# Patient Record
Sex: Female | Born: 1970 | Race: White | Hispanic: No | Marital: Single | Smoking: Current every day smoker
Health system: Southern US, Community
[De-identification: ages and names within clinical notes are randomized; demographics above are authoritative.]

## PROBLEM LIST (undated history)

## (undated) ENCOUNTER — Emergency Department: Discharge status: Absent without leave | Payer: Self-pay

## (undated) DIAGNOSIS — N189 Chronic kidney disease, unspecified: Secondary | ICD-10-CM

## (undated) DIAGNOSIS — R569 Unspecified convulsions: Secondary | ICD-10-CM

## (undated) DIAGNOSIS — G4730 Sleep apnea, unspecified: Secondary | ICD-10-CM

## (undated) DIAGNOSIS — K219 Gastro-esophageal reflux disease without esophagitis: Secondary | ICD-10-CM

## (undated) HISTORY — PX: ABDOMINAL HYSTERECTOMY: SHX81

## (undated) HISTORY — PX: WISDOM TOOTH EXTRACTION: SHX21

## (undated) HISTORY — PX: KNEE SURGERY: SHX244

---

## 1998-04-13 ENCOUNTER — Emergency Department (HOSPITAL_COMMUNITY): Admission: EM | Admit: 1998-04-13 | Discharge: 1998-04-13 | Payer: Self-pay | Admitting: Emergency Medicine

## 1998-06-19 ENCOUNTER — Emergency Department (HOSPITAL_COMMUNITY): Admission: EM | Admit: 1998-06-19 | Discharge: 1998-06-19 | Payer: Self-pay | Admitting: Emergency Medicine

## 2002-10-09 ENCOUNTER — Emergency Department (HOSPITAL_COMMUNITY): Admission: EM | Admit: 2002-10-09 | Discharge: 2002-10-10 | Payer: Self-pay | Admitting: Emergency Medicine

## 2002-10-09 ENCOUNTER — Encounter: Payer: Self-pay | Admitting: Emergency Medicine

## 2002-11-06 ENCOUNTER — Encounter: Payer: Self-pay | Admitting: Neurology

## 2002-11-06 ENCOUNTER — Ambulatory Visit (HOSPITAL_COMMUNITY): Admission: RE | Admit: 2002-11-06 | Discharge: 2002-11-06 | Payer: Self-pay | Admitting: Neurology

## 2003-10-28 ENCOUNTER — Other Ambulatory Visit: Payer: Self-pay

## 2004-07-13 ENCOUNTER — Emergency Department: Payer: Self-pay | Admitting: Emergency Medicine

## 2004-07-14 ENCOUNTER — Emergency Department: Payer: Self-pay | Admitting: Emergency Medicine

## 2005-03-31 ENCOUNTER — Emergency Department: Payer: Self-pay | Admitting: Emergency Medicine

## 2006-04-25 ENCOUNTER — Emergency Department: Payer: Self-pay

## 2006-09-09 ENCOUNTER — Emergency Department: Payer: Self-pay | Admitting: Unknown Physician Specialty

## 2008-04-09 ENCOUNTER — Ambulatory Visit: Payer: Self-pay | Admitting: Internal Medicine

## 2008-05-23 ENCOUNTER — Other Ambulatory Visit: Payer: Self-pay

## 2008-05-23 ENCOUNTER — Ambulatory Visit: Payer: Self-pay | Admitting: Obstetrics and Gynecology

## 2008-05-23 ENCOUNTER — Ambulatory Visit: Payer: Self-pay | Admitting: Cardiology

## 2008-05-31 ENCOUNTER — Inpatient Hospital Stay: Payer: Self-pay | Admitting: Obstetrics and Gynecology

## 2009-04-11 ENCOUNTER — Emergency Department: Payer: Self-pay | Admitting: Unknown Physician Specialty

## 2009-04-22 ENCOUNTER — Ambulatory Visit: Payer: Self-pay | Admitting: Internal Medicine

## 2009-06-08 ENCOUNTER — Emergency Department: Payer: Self-pay | Admitting: Internal Medicine

## 2009-11-10 ENCOUNTER — Emergency Department: Payer: Self-pay | Admitting: Emergency Medicine

## 2009-12-16 ENCOUNTER — Emergency Department: Payer: Self-pay

## 2010-05-14 ENCOUNTER — Emergency Department: Payer: Self-pay | Admitting: Emergency Medicine

## 2010-07-09 ENCOUNTER — Emergency Department: Payer: Self-pay | Admitting: Emergency Medicine

## 2010-07-21 ENCOUNTER — Ambulatory Visit: Payer: Self-pay | Admitting: Internal Medicine

## 2011-01-30 ENCOUNTER — Emergency Department: Payer: Self-pay | Admitting: Emergency Medicine

## 2011-02-04 ENCOUNTER — Emergency Department: Payer: Self-pay | Admitting: Emergency Medicine

## 2011-12-27 ENCOUNTER — Emergency Department: Payer: Self-pay | Admitting: Emergency Medicine

## 2011-12-27 LAB — DRUG SCREEN, URINE
Amphetamines, Ur Screen: NEGATIVE (ref ?–1000)
Barbiturates, Ur Screen: NEGATIVE (ref ?–200)
Cannabinoid 50 Ng, Ur ~~LOC~~: NEGATIVE (ref ?–50)
MDMA (Ecstasy)Ur Screen: NEGATIVE (ref ?–500)
Phencyclidine (PCP) Ur S: NEGATIVE (ref ?–25)
Tricyclic, Ur Screen: NEGATIVE (ref ?–1000)

## 2011-12-27 LAB — COMPREHENSIVE METABOLIC PANEL
Albumin: 4 g/dL (ref 3.4–5.0)
Alkaline Phosphatase: 76 U/L (ref 50–136)
Anion Gap: 9 (ref 7–16)
Bilirubin,Total: 0.5 mg/dL (ref 0.2–1.0)
Co2: 25 mmol/L (ref 21–32)
Creatinine: 0.87 mg/dL (ref 0.60–1.30)
EGFR (African American): >60
EGFR (Non-African Amer.): >60
Glucose: 130 mg/dL — ABNORMAL HIGH (ref 65–99)
Osmolality: 286 (ref 275–301)
Potassium: 4 mmol/L (ref 3.5–5.1)
Total Protein: 7.5 g/dL (ref 6.4–8.2)

## 2011-12-27 LAB — URINALYSIS, COMPLETE
Bilirubin,UR: NEGATIVE
Blood: NEGATIVE
Ketone: NEGATIVE
Ph: 7 (ref 4.5–8.0)
RBC,UR: <1 /HPF (ref 0–5)
Squamous Epithelial: 6
WBC UR: 1 /HPF (ref 0–5)

## 2011-12-27 LAB — TROPONIN I: Troponin-I: <0.02 ng/mL

## 2011-12-27 LAB — CBC
HGB: 14.1 g/dL (ref 12.0–16.0)
MCV: 93 fL (ref 80–100)
RDW: 12.2 % (ref 11.5–14.5)

## 2011-12-27 LAB — CK TOTAL AND CKMB (NOT AT ARMC): CK, Total: 84 U/L (ref 21–215)

## 2012-01-20 ENCOUNTER — Emergency Department: Payer: Self-pay | Admitting: *Deleted

## 2012-01-20 LAB — URINALYSIS, COMPLETE
Bilirubin,UR: NEGATIVE
Blood: NEGATIVE
Glucose,UR: NEGATIVE mg/dL (ref 0–75)
Ketone: NEGATIVE
Leukocyte Esterase: NEGATIVE
Nitrite: NEGATIVE
Specific Gravity: 1.004 (ref 1.003–1.030)
Squamous Epithelial: <1
WBC UR: <1 /HPF (ref 0–5)

## 2012-01-20 LAB — BASIC METABOLIC PANEL
BUN: 17 mg/dL (ref 7–18)
Calcium, Total: 8.6 mg/dL (ref 8.5–10.1)
Chloride: 106 mmol/L (ref 98–107)
Co2: 27 mmol/L (ref 21–32)
EGFR (African American): >60
Glucose: 126 mg/dL — ABNORMAL HIGH (ref 65–99)
Sodium: 141 mmol/L (ref 136–145)

## 2012-01-20 LAB — CBC WITH DIFFERENTIAL/PLATELET
Basophil #: 0 10*3/uL (ref 0.0–0.1)
Eosinophil #: 0.1 10*3/uL (ref 0.0–0.7)
HCT: 39.9 % (ref 35.0–47.0)
Lymphocyte %: 30.8 %
Monocyte #: 0.4 x10 3/mm (ref 0.2–0.9)
Neutrophil #: 3.9 10*3/uL (ref 1.4–6.5)
Neutrophil %: 60.1 %
Platelet: 193 10*3/uL (ref 150–440)
RBC: 4.32 10*6/uL (ref 3.80–5.20)
WBC: 6.4 10*3/uL (ref 3.6–11.0)

## 2012-03-18 ENCOUNTER — Emergency Department: Payer: Self-pay | Admitting: Emergency Medicine

## 2012-04-04 ENCOUNTER — Emergency Department: Payer: Self-pay | Admitting: Emergency Medicine

## 2012-04-04 LAB — CBC
HCT: 39.9 % (ref 35.0–47.0)
HGB: 14.3 g/dL (ref 12.0–16.0)
MCV: 91 fL (ref 80–100)
RDW: 12.4 % (ref 11.5–14.5)
WBC: 7.9 10*3/uL (ref 3.6–11.0)

## 2012-04-04 LAB — CK TOTAL AND CKMB (NOT AT ARMC): CK, Total: 96 U/L (ref 21–215)

## 2012-04-04 LAB — URINALYSIS, COMPLETE
Bacteria: NONE SEEN
Bilirubin,UR: NEGATIVE
Blood: NEGATIVE
Glucose,UR: NEGATIVE mg/dL (ref 0–75)
RBC,UR: NONE SEEN /HPF (ref 0–5)
Specific Gravity: 1.005 (ref 1.003–1.030)
Squamous Epithelial: <1
WBC UR: NONE SEEN /HPF (ref 0–5)

## 2012-04-04 LAB — COMPREHENSIVE METABOLIC PANEL
Albumin: 4.1 g/dL (ref 3.4–5.0)
Alkaline Phosphatase: 74 U/L (ref 50–136)
Anion Gap: 7 (ref 7–16)
Chloride: 107 mmol/L (ref 98–107)
Co2: 26 mmol/L (ref 21–32)
Creatinine: 0.95 mg/dL (ref 0.60–1.30)
Osmolality: 279 (ref 275–301)
SGOT(AST): 24 U/L (ref 15–37)
Sodium: 140 mmol/L (ref 136–145)
Total Protein: 7.6 g/dL (ref 6.4–8.2)

## 2012-04-04 LAB — TROPONIN I: Troponin-I: <0.02 ng/mL

## 2012-06-05 ENCOUNTER — Emergency Department: Payer: Self-pay | Admitting: Emergency Medicine

## 2012-12-02 ENCOUNTER — Emergency Department: Payer: Self-pay | Admitting: Emergency Medicine

## 2013-04-23 ENCOUNTER — Emergency Department: Payer: Self-pay | Admitting: Emergency Medicine

## 2013-04-23 LAB — CBC WITH DIFFERENTIAL/PLATELET
Eosinophil #: 0.4 10*3/uL (ref 0.0–0.7)
Eosinophil %: 3.1 %
HCT: 39.1 % (ref 35.0–47.0)
Lymphocyte #: 3 10*3/uL (ref 1.0–3.6)
Lymphocyte %: 26.8 %
MCHC: 34.3 g/dL (ref 32.0–36.0)
MCV: 92 fL (ref 80–100)
Monocyte #: 0.8 x10 3/mm (ref 0.2–0.9)
Neutrophil #: 7 10*3/uL — ABNORMAL HIGH (ref 1.4–6.5)
RDW: 12.7 % (ref 11.5–14.5)
WBC: 11.2 10*3/uL — ABNORMAL HIGH (ref 3.6–11.0)

## 2013-04-23 LAB — BASIC METABOLIC PANEL
Anion Gap: 5 — ABNORMAL LOW (ref 7–16)
BUN: 15 mg/dL (ref 7–18)
Calcium, Total: 9.1 mg/dL (ref 8.5–10.1)
Chloride: 110 mmol/L — ABNORMAL HIGH (ref 98–107)
Co2: 26 mmol/L (ref 21–32)
Creatinine: 0.92 mg/dL (ref 0.60–1.30)
Glucose: 85 mg/dL (ref 65–99)
Osmolality: 281 (ref 275–301)

## 2013-04-29 ENCOUNTER — Emergency Department: Payer: Self-pay | Admitting: Emergency Medicine

## 2013-04-29 LAB — URINALYSIS, COMPLETE
Bacteria: NONE SEEN
Blood: NEGATIVE
Glucose,UR: NEGATIVE mg/dL (ref 0–75)
Ketone: NEGATIVE
Leukocyte Esterase: NEGATIVE
Ph: 6 (ref 4.5–8.0)
Specific Gravity: 1.01 (ref 1.003–1.030)
WBC UR: 3 /HPF (ref 0–5)

## 2013-04-29 LAB — COMPREHENSIVE METABOLIC PANEL
Albumin: 3.5 g/dL (ref 3.4–5.0)
Alkaline Phosphatase: 94 U/L (ref 50–136)
BUN: 12 mg/dL (ref 7–18)
Calcium, Total: 8.5 mg/dL (ref 8.5–10.1)
Co2: 25 mmol/L (ref 21–32)
EGFR (African American): >60
Osmolality: 279 (ref 275–301)
SGOT(AST): 19 U/L (ref 15–37)
Total Protein: 7.1 g/dL (ref 6.4–8.2)

## 2013-04-29 LAB — CBC
HGB: 13.6 g/dL (ref 12.0–16.0)
MCHC: 35.3 g/dL (ref 32.0–36.0)
MCV: 92 fL (ref 80–100)
Platelet: 199 10*3/uL (ref 150–440)
RDW: 12.8 % (ref 11.5–14.5)

## 2013-05-03 LAB — BETA STREP CULTURE(ARMC)

## 2013-09-12 ENCOUNTER — Emergency Department: Payer: Self-pay | Admitting: Emergency Medicine

## 2013-09-14 ENCOUNTER — Emergency Department: Payer: Self-pay | Admitting: Emergency Medicine

## 2013-09-14 LAB — BASIC METABOLIC PANEL
Anion Gap: 6 — ABNORMAL LOW (ref 7–16)
BUN: 14 mg/dL (ref 7–18)
CALCIUM: 8.9 mg/dL (ref 8.5–10.1)
CHLORIDE: 106 mmol/L (ref 98–107)
Co2: 23 mmol/L (ref 21–32)
Creatinine: 0.83 mg/dL (ref 0.60–1.30)
EGFR (African American): >60
EGFR (Non-African Amer.): >60
Glucose: 102 mg/dL — ABNORMAL HIGH (ref 65–99)
Osmolality: 271 (ref 275–301)
POTASSIUM: 3.9 mmol/L (ref 3.5–5.1)
Sodium: 135 mmol/L — ABNORMAL LOW (ref 136–145)

## 2013-09-14 LAB — CBC
HCT: 43.8 % (ref 35.0–47.0)
HGB: 15 g/dL (ref 12.0–16.0)
MCH: 31.1 pg (ref 26.0–34.0)
MCHC: 34.2 g/dL (ref 32.0–36.0)
MCV: 91 fL (ref 80–100)
Platelet: 207 10*3/uL (ref 150–440)
RBC: 4.82 10*6/uL (ref 3.80–5.20)
RDW: 12 % (ref 11.5–14.5)
WBC: 7.1 10*3/uL (ref 3.6–11.0)

## 2013-09-15 LAB — URINALYSIS, COMPLETE
BACTERIA: NONE SEEN
BILIRUBIN, UR: NEGATIVE
Blood: NEGATIVE
Glucose,UR: NEGATIVE mg/dL (ref 0–75)
KETONE: NEGATIVE
NITRITE: NEGATIVE
PROTEIN: NEGATIVE
Ph: 5 (ref 4.5–8.0)
RBC,UR: 2 /HPF (ref 0–5)
Specific Gravity: 1.028 (ref 1.003–1.030)
WBC UR: 7 /HPF (ref 0–5)

## 2013-09-15 LAB — HEPATIC FUNCTION PANEL A (ARMC)
Albumin: 4.1 g/dL (ref 3.4–5.0)
Alkaline Phosphatase: 82 U/L
BILIRUBIN TOTAL: 0.3 mg/dL (ref 0.2–1.0)
Bilirubin, Direct: <0.1 mg/dL (ref 0.00–0.20)
SGOT(AST): 31 U/L (ref 15–37)
SGPT (ALT): 36 U/L (ref 12–78)
TOTAL PROTEIN: 7.8 g/dL (ref 6.4–8.2)

## 2013-09-15 LAB — CK: CK, Total: 64 U/L (ref 21–215)

## 2013-09-15 LAB — SEDIMENTATION RATE: Erythrocyte Sed Rate: 11 mm/hr (ref 0–20)

## 2013-09-15 LAB — RAPID INFLUENZA A&B ANTIGENS (ARMC ONLY)

## 2013-10-28 ENCOUNTER — Emergency Department: Payer: Self-pay | Admitting: Internal Medicine

## 2013-10-28 LAB — WET PREP, GENITAL

## 2013-10-28 LAB — GC/CHLAMYDIA PROBE AMP

## 2013-12-22 ENCOUNTER — Emergency Department: Payer: Self-pay | Admitting: Emergency Medicine

## 2014-02-23 ENCOUNTER — Emergency Department: Payer: Self-pay | Admitting: Emergency Medicine

## 2014-02-23 LAB — URINALYSIS, COMPLETE
Bacteria: NONE SEEN
Bilirubin,UR: NEGATIVE
Blood: NEGATIVE
GLUCOSE, UR: NEGATIVE mg/dL (ref 0–75)
Ketone: NEGATIVE
Leukocyte Esterase: NEGATIVE
Nitrite: NEGATIVE
PH: 7 (ref 4.5–8.0)
Protein: NEGATIVE
RBC,UR: NONE SEEN /HPF (ref 0–5)
Specific Gravity: 1.005 (ref 1.003–1.030)
WBC UR: 1 /HPF (ref 0–5)

## 2014-02-23 LAB — CBC
HCT: 44.3 % (ref 35.0–47.0)
HGB: 14.5 g/dL (ref 12.0–16.0)
MCH: 30.6 pg (ref 26.0–34.0)
MCHC: 32.8 g/dL (ref 32.0–36.0)
MCV: 93 fL (ref 80–100)
PLATELETS: 202 10*3/uL (ref 150–440)
RBC: 4.75 10*6/uL (ref 3.80–5.20)
RDW: 12.4 % (ref 11.5–14.5)
WBC: 7.2 10*3/uL (ref 3.6–11.0)

## 2014-02-23 LAB — DRUG SCREEN, URINE
AMPHETAMINES, UR SCREEN: NEGATIVE (ref ?–1000)
BENZODIAZEPINE, UR SCRN: NEGATIVE (ref ?–200)
Barbiturates, Ur Screen: NEGATIVE (ref ?–200)
CANNABINOID 50 NG, UR ~~LOC~~: NEGATIVE (ref ?–50)
COCAINE METABOLITE, UR ~~LOC~~: NEGATIVE (ref ?–300)
MDMA (Ecstasy)Ur Screen: NEGATIVE (ref ?–500)
METHADONE, UR SCREEN: NEGATIVE (ref ?–300)
Opiate, Ur Screen: NEGATIVE (ref ?–300)
PHENCYCLIDINE (PCP) UR S: NEGATIVE (ref ?–25)
Tricyclic, Ur Screen: NEGATIVE (ref ?–1000)

## 2014-02-23 LAB — COMPREHENSIVE METABOLIC PANEL
ALBUMIN: 4.2 g/dL (ref 3.4–5.0)
Alkaline Phosphatase: 72 U/L
Anion Gap: 7 (ref 7–16)
BUN: 12 mg/dL (ref 7–18)
Bilirubin,Total: 0.4 mg/dL (ref 0.2–1.0)
CALCIUM: 9.1 mg/dL (ref 8.5–10.1)
Chloride: 106 mmol/L (ref 98–107)
Co2: 26 mmol/L (ref 21–32)
Creatinine: 0.92 mg/dL (ref 0.60–1.30)
EGFR (African American): >60
EGFR (Non-African Amer.): >60
GLUCOSE: 104 mg/dL — AB (ref 65–99)
OSMOLALITY: 278 (ref 275–301)
Potassium: 4.1 mmol/L (ref 3.5–5.1)
SGOT(AST): 19 U/L (ref 15–37)
SGPT (ALT): 23 U/L (ref 12–78)
SODIUM: 139 mmol/L (ref 136–145)
Total Protein: 7.5 g/dL (ref 6.4–8.2)

## 2014-02-23 LAB — SALICYLATE LEVEL: Salicylates, Serum: 4.8 mg/dL — ABNORMAL HIGH

## 2014-02-23 LAB — ETHANOL
Ethanol %: <0.003 % (ref 0.000–0.080)
Ethanol: <3 mg/dL

## 2014-02-23 LAB — ACETAMINOPHEN LEVEL

## 2014-03-21 ENCOUNTER — Emergency Department: Payer: Self-pay | Admitting: Emergency Medicine

## 2015-03-08 ENCOUNTER — Emergency Department
Admission: EM | Admit: 2015-03-08 | Discharge: 2015-03-08 | Disposition: A | Discharge status: Home / Self Care | Payer: Medicare Other | Attending: Emergency Medicine | Admitting: Emergency Medicine

## 2015-03-08 DIAGNOSIS — R569 Unspecified convulsions: Secondary | ICD-10-CM | POA: Diagnosis present

## 2015-03-08 DIAGNOSIS — Z760 Encounter for issue of repeat prescription: Secondary | ICD-10-CM | POA: Insufficient documentation

## 2015-03-08 DIAGNOSIS — Z3202 Encounter for pregnancy test, result negative: Secondary | ICD-10-CM | POA: Diagnosis not present

## 2015-03-08 LAB — CBC
HCT: 40.7 % (ref 35.0–47.0)
HEMOGLOBIN: 13.7 g/dL (ref 12.0–16.0)
MCH: 30.9 pg (ref 26.0–34.0)
MCHC: 33.7 g/dL (ref 32.0–36.0)
MCV: 91.8 fL (ref 80.0–100.0)
PLATELETS: 229 10*3/uL (ref 150–440)
RBC: 4.44 MIL/uL (ref 3.80–5.20)
RDW: 12 % (ref 11.5–14.5)
WBC: 7.3 10*3/uL (ref 3.6–11.0)

## 2015-03-08 LAB — URINALYSIS COMPLETE WITH MICROSCOPIC (ARMC ONLY)
BILIRUBIN URINE: NEGATIVE
Glucose, UA: NEGATIVE mg/dL
Hgb urine dipstick: NEGATIVE
Ketones, ur: NEGATIVE mg/dL
NITRITE: NEGATIVE
Protein, ur: NEGATIVE mg/dL
Specific Gravity, Urine: 1.01 (ref 1.005–1.030)
pH: 6 (ref 5.0–8.0)

## 2015-03-08 LAB — URINE DRUG SCREEN, QUALITATIVE (ARMC ONLY)
Amphetamines, Ur Screen: NOT DETECTED
BARBITURATES, UR SCREEN: NOT DETECTED
BENZODIAZEPINE, UR SCRN: NOT DETECTED
Cannabinoid 50 Ng, Ur ~~LOC~~: NOT DETECTED
Cocaine Metabolite,Ur ~~LOC~~: NOT DETECTED
MDMA (Ecstasy)Ur Screen: NOT DETECTED
Methadone Scn, Ur: NOT DETECTED
OPIATE, UR SCREEN: NOT DETECTED
PHENCYCLIDINE (PCP) UR S: NOT DETECTED
TRICYCLIC, UR SCREEN: NOT DETECTED

## 2015-03-08 LAB — BASIC METABOLIC PANEL
ANION GAP: 8 (ref 5–15)
BUN: 13 mg/dL (ref 6–20)
CO2: 26 mmol/L (ref 22–32)
Calcium: 9.3 mg/dL (ref 8.9–10.3)
Chloride: 107 mmol/L (ref 101–111)
Creatinine, Ser: 1.06 mg/dL — ABNORMAL HIGH (ref 0.44–1.00)
GFR calc Af Amer: >60 mL/min (ref >60)
Glucose, Bld: 114 mg/dL — ABNORMAL HIGH (ref 65–99)
Potassium: 4 mmol/L (ref 3.5–5.1)
Sodium: 141 mmol/L (ref 135–145)

## 2015-03-08 LAB — PREGNANCY, URINE: PREG TEST UR: NEGATIVE

## 2015-03-08 MED ORDER — GABAPENTIN 300 MG PO CAPS
ORAL_CAPSULE | ORAL | Status: AC
  Filled 2015-03-08: qty 1

## 2015-03-08 MED ORDER — LEVETIRACETAM 250 MG PO TABS: ORAL_TABLET | ORAL | Status: AC

## 2015-03-08 MED ORDER — GABAPENTIN 300 MG PO CAPS: 300.0000 mg | ORAL_CAPSULE | Freq: Three times a day (TID) | ORAL | Status: DC

## 2015-03-08 MED ORDER — LEVETIRACETAM 500 MG PO TABS
1000.0000 mg | ORAL_TABLET | Freq: Once | ORAL | Status: AC
  Administered 2015-03-08: 1000 mg via ORAL

## 2015-03-08 MED ORDER — LEVETIRACETAM 500 MG PO TABS
ORAL_TABLET | ORAL | Status: AC
  Administered 2015-03-08: 1000 mg via ORAL
  Filled 2015-03-08: qty 2

## 2015-03-08 MED ORDER — GABAPENTIN 300 MG PO CAPS: 300.0000 mg | ORAL_CAPSULE | Freq: Three times a day (TID) | ORAL | Status: AC

## 2015-03-08 NOTE — ED Notes (Signed)
EMS pt to triage ambulatory. Pt reports she had her Gabapentin and Keppra refilled last Saturday. On Tuesday she dropped her medication down the toilet accidentally so she has not had her medication since Tuesday. Pt reports she woke up around 1am and states "I know I had a seizure because my bed was torn up and I was sweaty." Pt called EMS and came to the ED. Pt talking in full and complete sentences with no difficulty at this time. Pt states she was too embarrassed to ask for more medication and knows her insurance will not fill her medication again as she just got it filled.

## 2015-03-08 NOTE — Discharge Instructions (Signed)
Medication Refill, Emergency Department °We have refilled your medication today as a courtesy to you. It is best for your medical care, however, to take care of getting refills done through your primary caregiver's office. They have your records and can do a better job of follow-up than we can in the emergency department. °On maintenance medications, we often only prescribe enough medications to get you by until you are able to see your regular caregiver. This is a more expensive way to refill medications. °In the future, please plan for refills so that you will not have to use the emergency department for this. °Thank you for your help. Your help allows us to better take care of the daily emergencies that enter our department. °Document Released: 12/10/2003 Document Revised: 11/15/2011 Document Reviewed: 11/30/2013 °[employer]® Patient Information ©2015 [employer]. This information is not intended to replace advice given to you by your health care provider. Make sure you discuss any questions you have with your health care provider. ° °

## 2015-03-08 NOTE — ED Provider Notes (Signed)
[facility] Emergency Department Provider Note  ____________________________________________  Time seen: 5:30 AM  I have reviewed the triage vital signs and the nursing notes.   HISTORY  Chief Complaint Seizures     HPI Candace Jordan is a 44 y.o. female presents with history of eczema only flushing her pills down the toilet" 3 days prior. Medications included Keppra 250 mg tablets of which the patient takes 2 in the AM and 4 at night, gabapentin 300 mg patient takes 3 times a day     Past Medical History Seizure Chronic back pain     Allergies Sulfa antibiotics and Penicillins  No family history on file.  Social History History  Substance Use Topics  . Smoking status: Not on file  . Smokeless tobacco: Not on file  . Alcohol Use: Not on file    Review of Systems  Constitutional: Negative for fever. Eyes: Negative for visual changes. ENT: Negative for sore throat. Cardiovascular: Negative for chest pain. Respiratory: Negative for shortness of breath. Gastrointestinal: Negative for abdominal pain, vomiting and diarrhea. Genitourinary: Negative for dysuria. Musculoskeletal: Negative for back pain. Skin: Negative for rash. Neurological: Negative for headaches, focal weakness or numbness.  10-point ROS otherwise negative.  ____________________________________________   PHYSICAL EXAM:  VITAL SIGNS: ED Triage Vitals  Enc Vitals Group     BP 03/08/15 0348 117/80 mmHg     Pulse Rate 03/08/15 0348 65     Resp 03/08/15 0348 18     Temp 03/08/15 0348 98.2 F (36.8 C)     Temp Source 03/08/15 0348 Oral     SpO2 03/08/15 0348 96 %     Weight 03/08/15 0348 210 lb (95.255 kg)     Height 03/08/15 0348  (1.702 m)     Head Cir --      Peak Flow --      Pain Score 03/08/15 0349 2     Pain Loc --      Pain Edu? --      Excl. in GC? --      Constitutional: Alert and oriented. Well appearing and in no distress. Eyes:  Conjunctivae are normal. PERRL. Normal extraocular movements. ENT   Head: Normocephalic and atraumatic.   Nose: No congestion/rhinnorhea.   Mouth/Throat: Mucous membranes are moist.   Neck: No stridor. Hematological/Lymphatic/Immunilogical: No cervical lymphadenopathy. Cardiovascular: Normal rate, regular rhythm. Normal and symmetric distal pulses are present in all extremities. No murmurs, rubs, or gallops. Respiratory: Normal respiratory effort without tachypnea nor retractions. Breath sounds are clear and equal bilaterally. No wheezes/rales/rhonchi. Gastrointestinal: Soft and nontender. No distention. There is no CVA tenderness. Genitourinary: deferred Musculoskeletal: Nontender with normal range of motion in all extremities. No joint effusions.  No lower extremity tenderness nor edema. Neurologic:  Normal speech and language. No gross focal neurologic deficits are appreciated. Speech is normal.  Skin:  Skin is warm, dry and intact. No rash noted. Psychiatric: Mood and affect are normal. Speech and behavior are normal. Patient exhibits appropriate insight and judgment.  ____________________________________________    LABS (pertinent positives/negatives)  Labs Reviewed  BASIC METABOLIC PANEL - Abnormal; Notable for the following:    Glucose, Bld 114 (*)    Creatinine, Ser 1.06 (*)    All other components within normal limits  URINALYSIS COMPLETEWITH MICROSCOPIC ([facility] ONLY) - Abnormal; Notable for the following:    Color, Urine STRAW (*)    APPearance CLEAR (*)    Leukocytes, UA 1+ (*)    Bacteria,  UA MANY (*)    Squamous Epithelial / LPF 0-5 (*)    All other components within normal limits  CBC  URINE DRUG SCREEN, QUALITATIVE ([facility] ONLY)  PREGNANCY, URINE         INITIAL IMPRESSION / ASSESSMENT AND PLAN / ED COURSE  Pertinent labs & imaging results that were available during my care of the patient were reviewed by me and considered in my medical decision  making (see chart for details).  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

## 2015-04-15 ENCOUNTER — Emergency Department
Admission: EM | Admit: 2015-04-15 | Discharge: 2015-04-16 | Disposition: A | Discharge status: Home / Self Care | Payer: Medicare Other | Attending: Emergency Medicine | Admitting: Emergency Medicine

## 2015-04-15 ENCOUNTER — Encounter: Payer: Self-pay | Admitting: Urgent Care

## 2015-04-15 ENCOUNTER — Emergency Department: Payer: Medicare Other

## 2015-04-15 DIAGNOSIS — Z880 Allergy status to penicillin: Secondary | ICD-10-CM | POA: Diagnosis not present

## 2015-04-15 DIAGNOSIS — Y9389 Activity, other specified: Secondary | ICD-10-CM | POA: Insufficient documentation

## 2015-04-15 DIAGNOSIS — Z720 Tobacco use: Secondary | ICD-10-CM | POA: Insufficient documentation

## 2015-04-15 DIAGNOSIS — Y9289 Other specified places as the place of occurrence of the external cause: Secondary | ICD-10-CM | POA: Diagnosis not present

## 2015-04-15 DIAGNOSIS — S6991XA Unspecified injury of right wrist, hand and finger(s), initial encounter: Secondary | ICD-10-CM | POA: Diagnosis present

## 2015-04-15 DIAGNOSIS — W1839XA Other fall on same level, initial encounter: Secondary | ICD-10-CM | POA: Insufficient documentation

## 2015-04-15 DIAGNOSIS — S60211A Contusion of right wrist, initial encounter: Secondary | ICD-10-CM | POA: Diagnosis not present

## 2015-04-15 DIAGNOSIS — Z79899 Other long term (current) drug therapy: Secondary | ICD-10-CM | POA: Insufficient documentation

## 2015-04-15 DIAGNOSIS — Y998 Other external cause status: Secondary | ICD-10-CM | POA: Insufficient documentation

## 2015-04-15 HISTORY — DX: Sleep apnea, unspecified: G47.30

## 2015-04-15 HISTORY — DX: Unspecified convulsions: R56.9

## 2015-04-15 MED ORDER — IBUPROFEN 600 MG PO TABS: 600.0000 mg | ORAL_TABLET | Freq: Four times a day (QID) | ORAL | Status: DC | PRN

## 2015-04-15 MED ORDER — IBUPROFEN 400 MG PO TABS
600.0000 mg | ORAL_TABLET | Freq: Once | ORAL | Status: AC
  Administered 2015-04-15: 600 mg via ORAL
  Filled 2015-04-15: qty 2

## 2015-04-15 NOTE — ED Provider Notes (Signed)
[facility] Emergency Department Provider Note  ____________________________________________  Time seen: Approximately 10 PM  I have reviewed the triage vital signs and the nursing notes.   HISTORY  Chief Complaint Wrist Pain    HPI Candace Jordan is a 44 y.o. female with a history of seizures as well as sleep apnea who presents today after dropping a bed on her right wrist. She said that she was rearranging her apartment when her foot fell through the floor throwing her off balance and having her drop the foot of the couch on her right wrist. It dropped on the volar surface. She has been having pain ever since and difficulty ranging the wrist. She says that she wrapped in an Ace wrap prior to arrival which helped immobilize the joint and help with pain. The patient denies hitting her head or losing consciousness. She has not taken any pain medications prior to arrival.She says that the pain is radiating from the wrist up to her fingers and down to her elbow. Patient is right-hand dominant.   Past Medical History  Diagnosis Date  . Seizures   . Sleep apnea with use of continuous positive airway pressure (CPAP)     There are no active problems to display for this patient.   Past Surgical History  Procedure Laterality Date  . Abdominal hysterectomy    . Knee surgery Right     Current Outpatient Rx  Name  Route  Sig  Dispense  Refill  . gabapentin (NEURONTIN) 300 MG capsule   Oral   Take 1 capsule (300 mg total) by mouth 3 (three) times daily.   30 capsule   0   . levETIRAcetam (KEPPRA) 250 MG tablet      500 mg every morning and  every night   60 tablet   0     Allergies Sulfa antibiotics and Penicillins  No family history on file.  Social History History  Substance Use Topics  . Smoking status: Current Every Day Smoker  . Smokeless tobacco: Not on file  . Alcohol Use: No    Review of Systems Constitutional: No  fever/chills Eyes: No visual changes. ENT: No sore throat. Cardiovascular: Denies chest pain. Respiratory: Denies shortness of breath. Gastrointestinal: No abdominal pain.  No nausea, no vomiting.  No diarrhea.  No constipation. Genitourinary: Negative for dysuria. Musculoskeletal: Negative for back pain. Skin: Negative for rash. Neurological: Negative for headaches, focal weakness or numbness.  10-point ROS otherwise negative.  ____________________________________________   PHYSICAL EXAM:  VITAL SIGNS: ED Triage Vitals  Enc Vitals Group     BP 04/15/15 2128 133/90 mmHg     Pulse Rate 04/15/15 2128 90     Resp 04/15/15 2128 16     Temp 04/15/15 2128 98.3 F (36.8 C)     Temp Source 04/15/15 2128 Oral     SpO2 04/15/15 2128 100 %     Weight 04/15/15 2128 210 lb (95.255 kg)     Height 04/15/15 2128  (1.702 m)     Head Cir --      Peak Flow --      Pain Score 04/15/15 2129 5     Pain Loc --      Pain Edu? --      Excl. in GC? --     Constitutional: Alert and oriented.  in no acute distress. Eyes: Conjunctivae are normal. PERRL. EOMI. Head: Atraumatic. Nose: No congestion/rhinnorhea. Mouth/Throat: Mucous membranes are moist.  Oropharynx non-erythematous.  Neck: No stridor.   Cardiovascular: Normal rate, regular rhythm. Grossly normal heart sounds.  Good peripheral circulation. Respiratory: Normal respiratory effort.  No retractions. Lungs CTAB. Gastrointestinal: Soft and nontender. No distention. No abdominal bruits. No CVA tenderness. Musculoskeletal: No lower extremity tenderness nor edema.  No joint effusions. Holding right arm adductor that the shoulder and flex at the elbow. She is holding her wrist in an extended position with her left hand. She has full sensation as well as being neurovascularly intact. She is able to range all of her fingers without any difficulty. There is no deformity to the wrist. The patient says that it hurts to range and would rather not  range it. There is tenderness over the anatomic snuffbox. Neurologic:  Normal speech and language. No gross focal neurologic deficits are appreciated. No gait instability. Skin:  Skin is warm, dry and intact. No rash noted. Psychiatric: Mood and affect are normal. Speech and behavior are normal.  ____________________________________________   LABS (all labs ordered are listed, but only abnormal results are displayed)  Labs Reviewed - No data to display ____________________________________________  EKG   ____________________________________________  RADIOLOGY  Normal film of the right wrist. ____________________________________________   PROCEDURES    ____________________________________________   INITIAL IMPRESSION / ASSESSMENT AND PLAN / ED COURSE  Pertinent labs & imaging results that were available during my care of the patient were reviewed by me and considered in my medical decision making (see chart for details).  ----------------------------------------- 10:30 PM on 04/15/2015 -----------------------------------------  Patient now in right thumb spica splint. No skin splint on until seen by the orthopedist. We'll give ibuprofen in the emergency department as well as a prescription for home. Splinted secondary to possible scaphoid fracture because of tenderness to the anatomic snuffbox. After splinting the patient is neurovascularly intact. Says the splint is not too tight. Able to range fingers fully. Likely soft tissue injury to the wrist. We'll discharge to home. ____________________________________________   FINAL CLINICAL IMPRESSION(S) / ED DIAGNOSES  Right wrist injury. Initial visit.    Myrna Blazer, MD 04/15/15 [phone]

## 2015-04-15 NOTE — ED Notes (Signed)
Patient presents to the ED with c/o RIGHT wrist pain. Patient reports that she was rearranging her room and "fell through the floor"  - reports that bed "fell on wrist". (+) swelling. (+) PMS noted; cap refill WNL.

## 2015-06-18 ENCOUNTER — Other Ambulatory Visit: Payer: Self-pay | Admitting: Orthopedic Surgery

## 2015-06-18 DIAGNOSIS — M25531 Pain in right wrist: Secondary | ICD-10-CM

## 2015-06-18 DIAGNOSIS — S63501D Unspecified sprain of right wrist, subsequent encounter: Secondary | ICD-10-CM

## 2015-06-20 ENCOUNTER — Other Ambulatory Visit: Payer: Self-pay | Admitting: Orthopedic Surgery

## 2015-06-20 DIAGNOSIS — M25561 Pain in right knee: Secondary | ICD-10-CM

## 2015-06-20 DIAGNOSIS — M7121 Synovial cyst of popliteal space [Baker], right knee: Secondary | ICD-10-CM

## 2015-06-26 ENCOUNTER — Ambulatory Visit
Admission: RE | Admit: 2015-06-26 | Discharge: 2015-06-26 | Disposition: A | Discharge status: Home / Self Care | Payer: Medicare Other | Source: Ambulatory Visit | Attending: Orthopedic Surgery | Admitting: Orthopedic Surgery

## 2015-06-26 DIAGNOSIS — S63501D Unspecified sprain of right wrist, subsequent encounter: Secondary | ICD-10-CM

## 2015-06-26 DIAGNOSIS — M25531 Pain in right wrist: Secondary | ICD-10-CM

## 2015-06-26 DIAGNOSIS — M25561 Pain in right knee: Secondary | ICD-10-CM | POA: Diagnosis not present

## 2015-06-30 ENCOUNTER — Ambulatory Visit
Admission: RE | Admit: 2015-06-30 | Discharge: 2015-06-30 | Disposition: A | Discharge status: Home / Self Care | Payer: Medicare Other | Source: Ambulatory Visit | Attending: Orthopedic Surgery | Admitting: Orthopedic Surgery

## 2015-06-30 DIAGNOSIS — M7121 Synovial cyst of popliteal space [Baker], right knee: Secondary | ICD-10-CM

## 2015-06-30 DIAGNOSIS — M948X6 Other specified disorders of cartilage, lower leg: Secondary | ICD-10-CM | POA: Diagnosis not present

## 2015-06-30 DIAGNOSIS — S83281A Other tear of lateral meniscus, current injury, right knee, initial encounter: Secondary | ICD-10-CM | POA: Insufficient documentation

## 2015-06-30 DIAGNOSIS — M25561 Pain in right knee: Secondary | ICD-10-CM

## 2015-06-30 DIAGNOSIS — M94261 Chondromalacia, right knee: Secondary | ICD-10-CM | POA: Diagnosis not present

## 2015-07-22 ENCOUNTER — Encounter: Payer: Self-pay | Admitting: *Deleted

## 2015-07-22 ENCOUNTER — Other Ambulatory Visit: Payer: Self-pay

## 2015-07-22 MED ORDER — BUPIVACAINE HCL (PF) 0.5 % IJ SOLN
INTRAMUSCULAR | Status: AC
  Filled 2015-07-22: qty 30

## 2015-07-22 NOTE — Patient Instructions (Addendum)
  Your procedure is scheduled on: 07-24-15 Report to [facility] 2ND FLOOR To find out your arrival time please call [phone] between 1PM - 3PM on 07-23-15  Remember: Instructions that are not followed completely may result in serious medical risk, up to and including death, or upon the discretion of your surgeon and anesthesiologist your surgery may need to be rescheduled.    _X___ 1. Do not eat food or drink liquids after midnight. No gum chewing or hard candies.     _X___ 2. No Alcohol for 24 hours before or after surgery.   ____ 3. Bring all medications with you on the day of surgery if instructed.    ____ 4. Notify your doctor if there is any change in your medical condition     (cold, fever, infections).     Do not wear jewelry, make-up, hairpins, clips or nail polish.  Do not wear lotions, powders, or perfumes. You may wear deodorant.  Do not shave 48 hours prior to surgery. Men may shave face and neck.  Do not bring valuables to the hospital.    [facility] for any belongings or valuables.               Contacts, dentures or bridgework may not be worn into surgery.  Leave your suitcase in the car. After surgery it may be brought to your room.  For patients admitted to the hospital, discharge time is determined by your  treatment team.   Patients discharged the day of surgery will not be allowed to drive home.   Please read over the following fact sheets that you were given:      _X___ Take these medicines the morning of surgery with A SIP OF WATER:    1. CITALOPRAM  2. GABAPENTIN  3. KEPPRA  4. OMEPRAZOLE  5. TAKE AN EXTRA OMEPRAZOLE ON Wednesday NIGHT  6.  ____ Fleet Enema (as directed)   ____ Use CHG Soap as directed  ____ Use inhalers on the day of surgery  ____ Stop metformin 2 days prior to surgery    ____ Take 1/2 of usual insulin dose the night before surgery and none on the morning of surgery.   ____ Stop  Coumadin/Plavix/aspirin-N/A  ____ Stop Anti-inflammatories-NO NSAIDS OR ASA PRODUCTS-TYLENOL OK   _X___ Stop supplements until after surgery-STOP FISH OIL NOW   _X___ Bring C-Pap to the hospital.

## 2015-07-24 ENCOUNTER — Ambulatory Visit: Payer: Medicare Other | Admitting: Anesthesiology

## 2015-07-24 ENCOUNTER — Encounter: Payer: Self-pay | Admitting: *Deleted

## 2015-07-24 ENCOUNTER — Encounter
Admission: RE | Disposition: A | Discharge status: Home / Self Care | Payer: Self-pay | Source: Ambulatory Visit | Attending: Orthopedic Surgery

## 2015-07-24 ENCOUNTER — Ambulatory Visit
Admission: RE | Admit: 2015-07-24 | Discharge: 2015-07-24 | Disposition: A | Discharge status: Home / Self Care | Payer: Medicare Other | Source: Ambulatory Visit | Attending: Orthopedic Surgery | Admitting: Orthopedic Surgery

## 2015-07-24 DIAGNOSIS — Z886 Allergy status to analgesic agent status: Secondary | ICD-10-CM | POA: Diagnosis not present

## 2015-07-24 DIAGNOSIS — M1990 Unspecified osteoarthritis, unspecified site: Secondary | ICD-10-CM | POA: Diagnosis not present

## 2015-07-24 DIAGNOSIS — Z6834 Body mass index (BMI) 34.0-34.9, adult: Secondary | ICD-10-CM | POA: Diagnosis not present

## 2015-07-24 DIAGNOSIS — M23241 Derangement of anterior horn of lateral meniscus due to old tear or injury, right knee: Secondary | ICD-10-CM | POA: Diagnosis present

## 2015-07-24 DIAGNOSIS — E669 Obesity, unspecified: Secondary | ICD-10-CM | POA: Insufficient documentation

## 2015-07-24 DIAGNOSIS — Z882 Allergy status to sulfonamides status: Secondary | ICD-10-CM | POA: Insufficient documentation

## 2015-07-24 DIAGNOSIS — M23211 Derangement of anterior horn of medial meniscus due to old tear or injury, right knee: Secondary | ICD-10-CM | POA: Diagnosis present

## 2015-07-24 DIAGNOSIS — M23251 Derangement of posterior horn of lateral meniscus due to old tear or injury, right knee: Secondary | ICD-10-CM | POA: Diagnosis present

## 2015-07-24 DIAGNOSIS — Z79899 Other long term (current) drug therapy: Secondary | ICD-10-CM | POA: Diagnosis not present

## 2015-07-24 DIAGNOSIS — G4730 Sleep apnea, unspecified: Secondary | ICD-10-CM | POA: Insufficient documentation

## 2015-07-24 DIAGNOSIS — R569 Unspecified convulsions: Secondary | ICD-10-CM | POA: Diagnosis not present

## 2015-07-24 DIAGNOSIS — F329 Major depressive disorder, single episode, unspecified: Secondary | ICD-10-CM | POA: Insufficient documentation

## 2015-07-24 DIAGNOSIS — Z880 Allergy status to penicillin: Secondary | ICD-10-CM | POA: Diagnosis not present

## 2015-07-24 DIAGNOSIS — K219 Gastro-esophageal reflux disease without esophagitis: Secondary | ICD-10-CM | POA: Insufficient documentation

## 2015-07-24 HISTORY — DX: Gastro-esophageal reflux disease without esophagitis: K21.9

## 2015-07-24 HISTORY — PX: KNEE ARTHROSCOPY: SHX127

## 2015-07-24 HISTORY — DX: Chronic kidney disease, unspecified: N18.9

## 2015-07-24 SURGERY — ARTHROSCOPY, KNEE
Anesthesia: General | Laterality: Right

## 2015-07-24 MED ORDER — HYDROCODONE-ACETAMINOPHEN 5-325 MG PO TABS
1.0000 | ORAL_TABLET | ORAL | Status: DC | PRN
  Administered 2015-07-24: 1 via ORAL

## 2015-07-24 MED ORDER — MIDAZOLAM HCL 2 MG/2ML IJ SOLN
INTRAMUSCULAR | Status: DC | PRN
  Administered 2015-07-24: 2 mg via INTRAVENOUS

## 2015-07-24 MED ORDER — LIDOCAINE HCL (PF) 1 % IJ SOLN
INTRAMUSCULAR | Status: AC
  Filled 2015-07-24: qty 2

## 2015-07-24 MED ORDER — HYDROMORPHONE HCL 1 MG/ML IJ SOLN
INTRAMUSCULAR | Status: AC
  Filled 2015-07-24: qty 1

## 2015-07-24 MED ORDER — ONDANSETRON HCL 4 MG/2ML IJ SOLN: 4.0000 mg | Freq: Four times a day (QID) | INTRAMUSCULAR | Status: DC | PRN

## 2015-07-24 MED ORDER — GLYCOPYRROLATE 0.2 MG/ML IJ SOLN
INTRAMUSCULAR | Status: DC | PRN
  Administered 2015-07-24: 0.2 mg via INTRAVENOUS

## 2015-07-24 MED ORDER — ONDANSETRON HCL 4 MG/2ML IJ SOLN
INTRAMUSCULAR | Status: DC | PRN
  Administered 2015-07-24: 4 mg via INTRAVENOUS

## 2015-07-24 MED ORDER — METOCLOPRAMIDE HCL 10 MG PO TABS: 5.0000 mg | ORAL_TABLET | Freq: Three times a day (TID) | ORAL | Status: DC | PRN

## 2015-07-24 MED ORDER — FENTANYL CITRATE (PF) 100 MCG/2ML IJ SOLN
25.0000 ug | INTRAMUSCULAR | Status: AC | PRN
  Administered 2015-07-24 (×6): 25 ug via INTRAVENOUS

## 2015-07-24 MED ORDER — LACTATED RINGERS IV SOLN
INTRAVENOUS | Status: DC
  Administered 2015-07-24: 15:00:00 via INTRAVENOUS

## 2015-07-24 MED ORDER — FENTANYL CITRATE (PF) 100 MCG/2ML IJ SOLN
INTRAMUSCULAR | Status: AC
  Filled 2015-07-24: qty 2

## 2015-07-24 MED ORDER — FENTANYL CITRATE (PF) 100 MCG/2ML IJ SOLN
INTRAMUSCULAR | Status: DC | PRN
  Administered 2015-07-24 (×3): 50 ug via INTRAVENOUS

## 2015-07-24 MED ORDER — PROPOFOL 10 MG/ML IV BOLUS
INTRAVENOUS | Status: DC | PRN
  Administered 2015-07-24: 150 mg via INTRAVENOUS

## 2015-07-24 MED ORDER — BUPIVACAINE-EPINEPHRINE (PF) 0.5% -1:200000 IJ SOLN
INTRAMUSCULAR | Status: DC | PRN
  Administered 2015-07-24: 20 mL via PERINEURAL

## 2015-07-24 MED ORDER — METOCLOPRAMIDE HCL 5 MG/ML IJ SOLN: 5.0000 mg | Freq: Three times a day (TID) | INTRAMUSCULAR | Status: DC | PRN

## 2015-07-24 MED ORDER — HYDROCODONE-ACETAMINOPHEN 5-325 MG PO TABS: 1.0000 | ORAL_TABLET | Freq: Four times a day (QID) | ORAL | Status: DC | PRN

## 2015-07-24 MED ORDER — ONDANSETRON HCL 4 MG/2ML IJ SOLN: 4.0000 mg | Freq: Once | INTRAMUSCULAR | Status: DC | PRN

## 2015-07-24 MED ORDER — HYDROMORPHONE HCL 1 MG/ML IJ SOLN
0.5000 mg | INTRAMUSCULAR | Status: DC | PRN
  Administered 2015-07-24 (×4): 0.5 mg via INTRAVENOUS

## 2015-07-24 MED ORDER — BUPIVACAINE-EPINEPHRINE (PF) 0.5% -1:200000 IJ SOLN
INTRAMUSCULAR | Status: AC
  Filled 2015-07-24: qty 30

## 2015-07-24 MED ORDER — ONDANSETRON HCL 4 MG PO TABS: 4.0000 mg | ORAL_TABLET | Freq: Four times a day (QID) | ORAL | Status: DC | PRN

## 2015-07-24 SURGICAL SUPPLY — 29 items
BANDAGE ACE 4X5 VEL STRL LF (GAUZE/BANDAGES/DRESSINGS) ×3 IMPLANT
BANDAGE ELASTIC 4 LF NS (GAUZE/BANDAGES/DRESSINGS) ×3 IMPLANT
BLADE FULL RADIUS 3.5 (BLADE) ×1 IMPLANT
BLADE INCISOR PLUS 4.5 (BLADE) ×1 IMPLANT
BLADE SHAVER 4.5 DBL SERAT CV (CUTTER) ×1 IMPLANT
BLADE SHAVER 4.5X7 STR FR (MISCELLANEOUS) ×1 IMPLANT
BNDG CMPR MED 5X4 ELC HKLP NS (GAUZE/BANDAGES/DRESSINGS) ×1
CHLORAPREP W/TINT 26ML (MISCELLANEOUS) ×3 IMPLANT
CUTTER AGGRESSIVE+ 3.5 (CUTTER) ×3 IMPLANT
GAUZE PETRO XEROFOAM 1X8 (MISCELLANEOUS) ×3 IMPLANT
GAUZE SPONGE 4X4 12PLY STRL (GAUZE/BANDAGES/DRESSINGS) ×3 IMPLANT
GLOVE BIOGEL PI IND STRL 9 (GLOVE) ×1 IMPLANT
GLOVE BIOGEL PI INDICATOR 9 (GLOVE) ×2
GLOVE SURG ORTHO 9.0 STRL STRW (GLOVE) ×3 IMPLANT
GOWN SPECIALTY ULTRA XL (MISCELLANEOUS) ×3 IMPLANT
GOWN STRL REUS W/ TWL LRG LVL3 (GOWN DISPOSABLE) ×1 IMPLANT
GOWN STRL REUS W/TWL LRG LVL3 (GOWN DISPOSABLE) ×3
IV LACTATED RINGER IRRG 3000ML (IV SOLUTION) ×12
IV LR IRRIG 3000ML ARTHROMATIC (IV SOLUTION) ×4 IMPLANT
KIT RM TURNOVER STRD PROC AR (KITS) ×3 IMPLANT
MANIFOLD NEPTUNE II (INSTRUMENTS) ×3 IMPLANT
PACK ARTHROSCOPY KNEE (MISCELLANEOUS) ×3 IMPLANT
SET TUBE SUCT SHAVER OUTFL 24K (TUBING) ×3 IMPLANT
SET TUBE TIP INTRA-ARTICULAR (MISCELLANEOUS) ×3 IMPLANT
SUT ETHILON 4-0 (SUTURE) ×3
SUT ETHILON 4-0 FS2 18XMFL BLK (SUTURE) ×1
SUTURE ETHLN 4-0 FS2 18XMF BLK (SUTURE) ×1 IMPLANT
TUBING ARTHRO INFLOW-ONLY STRL (TUBING) ×3 IMPLANT
WAND HAND CNTRL MULTIVAC 50 (MISCELLANEOUS) ×3 IMPLANT

## 2015-07-24 NOTE — Transfer of Care (Signed)
Immediate Anesthesia Transfer of Care Note  Patient: Candace SlatesCynthia A Knoff  Procedure(s) Performed: Procedure(s): ARTHROSCOPY KNEE partial medial and lateral menisectomy and lateral release (Right)  Patient Location: PACU  Anesthesia Type:General  Level of Consciousness: awake, alert  and oriented  Airway & Oxygen Therapy: Patient Spontanous Breathing and Patient connected to face mask oxygen  Post-op Assessment: Report given to RN and Post -op Vital signs reviewed and stable  Post vital signs: Reviewed and stable  Last Vitals:  Filed Vitals:   07/24/15 1608  BP: 126/84  Pulse: 87  Temp: 37.6 C  Resp: 14    Complications: No apparent anesthesia complications

## 2015-07-24 NOTE — Discharge Instructions (Addendum)
Weightbearing as tolerated on the right leg. Take one aspirin a day for 2 weeks, minimize weightbearing is much as possible.AMBULATORY SURGERY  DISCHARGE INSTRUCTIONS   1) The drugs that you were given will stay in your system until tomorrow so for the next 24 hours you should not:  A) Drive an automobile B) Make any legal decisions C) Drink any alcoholic beverage   2) You may resume regular meals tomorrow.  Today it is better to start with liquids and gradually work up to solid foods.  You may eat anything you prefer, but it is better to start with liquids, then soup and crackers, and gradually work up to solid foods.   3) Please notify your doctor immediately if you have any unusual bleeding, trouble breathing, redness and pain at the surgery site, drainage, fever, or pain not relieved by medication.    4) Additional Instructions:        Please contact your physician with any problems or Same Day Surgery at [phone], Monday through Friday 6 am to 4 pm, or [place] [facility] Main number at [phone].

## 2015-07-24 NOTE — Anesthesia Preprocedure Evaluation (Signed)
Anesthesia Evaluation  Patient identified by MRN, date of birth, ID band Patient awake    Reviewed: Allergy & Precautions, NPO status , Patient's Chart, lab work & pertinent test results, reviewed documented beta blocker date and time   Airway Mallampati: III  TM Distance: >3 FB     Dental  (+) Chipped   Pulmonary sleep apnea and Continuous Positive Airway Pressure Ventilation , Current Smoker,           Cardiovascular      Neuro/Psych Seizures -,     GI/Hepatic GERD  ,  Endo/Other    Renal/GU Renal InsufficiencyRenal disease     Musculoskeletal   Abdominal   Peds  Hematology   Anesthesia Other Findings Last seizure 2015. Obese.  Reproductive/Obstetrics                             Anesthesia Physical Anesthesia Plan  ASA: III  Anesthesia Plan: General   Post-op Pain Management:    Induction: Intravenous  Airway Management Planned: LMA  Additional Equipment:   Intra-op Plan:   Post-operative Plan:   Informed Consent: I have reviewed the patients History and Physical, chart, labs and discussed the procedure including the risks, benefits and alternatives for the proposed anesthesia with the patient or authorized representative who has indicated his/her understanding and acceptance.     Plan Discussed with: CRNA  Anesthesia Plan Comments:         Anesthesia Quick Evaluation

## 2015-07-24 NOTE — H&P (Signed)
Reviewed paper H+P, will be scanned into chart. No changes noted.  

## 2015-07-24 NOTE — Op Note (Signed)
07/24/2015  4:15 PM  PATIENT:  Candace Jordan  44 y.o. female  PRE-OPERATIVE DIAGNOSIS:  LATERAL MENISCUS TEAR and patellofemoral arthritis  POST-OPERATIVE DIAGNOSIS:  medial and lateral meniscus tears and patellofemoral arthritis tight lateral retinaculum  PROCEDURE:  Procedure(s): ARTHROSCOPY KNEE partial medial and lateral menisectomy and lateral release (Right)  SURGEON: Leitha SchullerMichael J Aaleah Hirsch, MD  ASSISTANTS: None  ANESTHESIA:   general  EBL:  Total I/O In: 400 [I.V.:400] Out: -   BLOOD ADMINISTERED:none  DRAINS: none   LOCAL MEDICATIONS USED:  MARCAINE     SPECIMEN:  No Specimen  DISPOSITION OF SPECIMEN:  N/A  COUNTS:  YES  TOURNIQUET:   none  IMPLANTS: None  DICTATION: .Dragon Dictation patient was brought to the operating room and after adequate general anesthesia was obtained, the right leg was placed in arthroscopic leg holder with a tourniquet applied and after prepping draping the sterile fashion appropriate patient identification and timeout procedures were completed. An inferior lateral portal was made and the arthroscope was introduced initial inspection revealed significant lateral facet degenerative changes with exposed bone on the lateral facet and central fissuring of the articular cartilage of the patella medial facet appeared relatively normal. The lateral trochlea was also arthritic with partial-thickness loss of the articular cartilage. The tight lateral retinaculum was quite tight. Coming around medially and inferior medial portal was made and on probing there is an anterior horn tear of the medial meniscus with a small flap tear anteriorly remainder of the meniscus and articular cartilage was normal and ArthriCare wand was used to ablate the torn cartilage anteriorly on the medial side. Anterior cruciate ligament is intact notch was normal in appearance: Laterally there was a small meniscus tear present anteriorly and a flap tear posterior from its root  looked into the notch and into the lateral joint space this is debrided with a wand to ablate this of the meniscus otherwise appeared normal as did the articular cartilage. At this point with some meniscal tears being addressed the ArthriCare wand was used to perform a lateral release centralizing the patella and releasing pressure on the patellofemoral joint the knee was irrigated until clear argentation withdrawn. 20 cc half percent Sensorcaine was infiltrated into the areas of portals were was up analgesia. Xeroform 4 x 4 web roll and Ace wrap applied  PLAN OF CARE: Discharge to home after PACU  PATIENT DISPOSITION:  PACU - hemodynamically stable.

## 2015-07-24 NOTE — Anesthesia Procedure Notes (Signed)
Procedure Name: LMA Insertion Date/Time: 07/24/2015 3:19 PM Performed by: Omer JackWEATHERLY, Kenneisha Cochrane Pre-anesthesia Checklist: Patient identified, Patient being monitored, Timeout performed, Emergency Drugs available and Suction available Patient Re-evaluated:Patient Re-evaluated prior to inductionOxygen Delivery Method: Circle system utilized Preoxygenation: Pre-oxygenation with 100% oxygen Intubation Type: IV induction Ventilation: Mask ventilation without difficulty LMA: LMA inserted LMA Size: 3.5 Tube type: Oral Number of attempts: 1 Placement Confirmation: positive ETCO2 and breath sounds checked- equal and bilateral Tube secured with: Tape Dental Injury: Teeth and Oropharynx as per pre-operative assessment

## 2015-07-25 ENCOUNTER — Encounter: Payer: Self-pay | Admitting: Orthopedic Surgery

## 2015-07-25 NOTE — Anesthesia Postprocedure Evaluation (Signed)
  Anesthesia Post-op Note  Patient: Candace Jordan  Procedure(s) Performed: Procedure(s): ARTHROSCOPY KNEE partial medial and lateral menisectomy and lateral release (Right)  Anesthesia type:General  Patient location: PACU  Post pain: Pain level controlled  Post assessment: Post-op Vital signs reviewed, Patient's Cardiovascular Status Stable, Respiratory Function Stable, Patent Airway and No signs of Nausea or vomiting  Post vital signs: Reviewed and stable  Last Vitals:  Filed Vitals:   07/24/15 1721  BP: 111/53  Pulse: 81  Temp:   Resp: 16    Level of consciousness: awake, alert  and patient cooperative  Complications: No apparent anesthesia complications

## 2016-04-08 ENCOUNTER — Encounter: Payer: Self-pay | Admitting: Emergency Medicine

## 2016-04-08 ENCOUNTER — Emergency Department
Admission: EM | Admit: 2016-04-08 | Discharge: 2016-04-08 | Disposition: A | Discharge status: Home / Self Care | Payer: No Typology Code available for payment source | Attending: Emergency Medicine | Admitting: Emergency Medicine

## 2016-04-08 ENCOUNTER — Emergency Department: Payer: No Typology Code available for payment source

## 2016-04-08 DIAGNOSIS — Y939 Activity, unspecified: Secondary | ICD-10-CM | POA: Insufficient documentation

## 2016-04-08 DIAGNOSIS — Y999 Unspecified external cause status: Secondary | ICD-10-CM | POA: Diagnosis not present

## 2016-04-08 DIAGNOSIS — Z79899 Other long term (current) drug therapy: Secondary | ICD-10-CM | POA: Insufficient documentation

## 2016-04-08 DIAGNOSIS — F17210 Nicotine dependence, cigarettes, uncomplicated: Secondary | ICD-10-CM | POA: Diagnosis not present

## 2016-04-08 DIAGNOSIS — Y92410 Unspecified street and highway as the place of occurrence of the external cause: Secondary | ICD-10-CM | POA: Diagnosis not present

## 2016-04-08 DIAGNOSIS — S3992XA Unspecified injury of lower back, initial encounter: Secondary | ICD-10-CM | POA: Diagnosis present

## 2016-04-08 DIAGNOSIS — N189 Chronic kidney disease, unspecified: Secondary | ICD-10-CM | POA: Diagnosis not present

## 2016-04-08 DIAGNOSIS — S39012A Strain of muscle, fascia and tendon of lower back, initial encounter: Secondary | ICD-10-CM | POA: Insufficient documentation

## 2016-04-08 MED ORDER — MELOXICAM 15 MG PO TABS: 15.0000 mg | ORAL_TABLET | Freq: Every day | ORAL | 0 refills | Status: DC

## 2016-04-08 MED ORDER — METHOCARBAMOL 500 MG PO TABS: 500.0000 mg | ORAL_TABLET | Freq: Four times a day (QID) | ORAL | 0 refills | Status: DC

## 2016-04-08 NOTE — ED Notes (Signed)
Patient transported to X-ray 
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
## 2016-04-10 ENCOUNTER — Encounter: Payer: Self-pay | Admitting: Emergency Medicine

## 2016-04-10 ENCOUNTER — Emergency Department: Payer: No Typology Code available for payment source

## 2016-04-10 ENCOUNTER — Emergency Department
Admission: EM | Admit: 2016-04-10 | Discharge: 2016-04-10 | Disposition: A | Discharge status: Home / Self Care | Payer: No Typology Code available for payment source | Attending: Emergency Medicine | Admitting: Emergency Medicine

## 2016-04-10 DIAGNOSIS — Y939 Activity, unspecified: Secondary | ICD-10-CM | POA: Diagnosis not present

## 2016-04-10 DIAGNOSIS — R519 Headache, unspecified: Secondary | ICD-10-CM

## 2016-04-10 DIAGNOSIS — R51 Headache: Secondary | ICD-10-CM | POA: Diagnosis not present

## 2016-04-10 DIAGNOSIS — F17210 Nicotine dependence, cigarettes, uncomplicated: Secondary | ICD-10-CM | POA: Insufficient documentation

## 2016-04-10 DIAGNOSIS — Y999 Unspecified external cause status: Secondary | ICD-10-CM | POA: Insufficient documentation

## 2016-04-10 DIAGNOSIS — Z79899 Other long term (current) drug therapy: Secondary | ICD-10-CM | POA: Diagnosis not present

## 2016-04-10 DIAGNOSIS — Z791 Long term (current) use of non-steroidal anti-inflammatories (NSAID): Secondary | ICD-10-CM | POA: Diagnosis not present

## 2016-04-10 DIAGNOSIS — Y92410 Unspecified street and highway as the place of occurrence of the external cause: Secondary | ICD-10-CM | POA: Insufficient documentation

## 2016-04-10 DIAGNOSIS — N189 Chronic kidney disease, unspecified: Secondary | ICD-10-CM | POA: Diagnosis not present
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
## 2016-08-09 ENCOUNTER — Ambulatory Visit: Payer: Medicare Other | Attending: Physician Assistant | Admitting: Physical Therapy

## 2016-08-09 DIAGNOSIS — M542 Cervicalgia: Secondary | ICD-10-CM | POA: Diagnosis not present

## 2016-08-09 DIAGNOSIS — M79622 Pain in left upper arm: Secondary | ICD-10-CM | POA: Insufficient documentation
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
## 2016-08-11 ENCOUNTER — Ambulatory Visit: Payer: Medicare Other | Admitting: Physical Therapy

## 2016-08-18 ENCOUNTER — Ambulatory Visit: Payer: Medicare Other | Admitting: Physical Therapy

## 2016-08-25 ENCOUNTER — Ambulatory Visit: Payer: Medicare Other | Admitting: Physical Therapy

## 2016-09-01 ENCOUNTER — Ambulatory Visit: Payer: Medicare Other | Admitting: Physical Therapy

## 2016-09-01 DIAGNOSIS — M542 Cervicalgia: Secondary | ICD-10-CM | POA: Diagnosis not present

## 2016-09-01 DIAGNOSIS — M79622 Pain in left upper arm: Secondary | ICD-10-CM

## 2016-09-01 NOTE — Therapy (Signed)
[place] [facility] [address] [place], [place], [address] Phone: [phone]   Fax:  [phone]  Physical Therapy Treatment  Patient Details  Name: Candace Jordan MRN: [id number] Date of Birth: 1971-05-18 Referring Provider: Van ClinesJon Wolfe  Encounter Date: 09/01/2016      PT End of Session - 09/01/16 1722    Visit Number 2   Number of Visits 9   Date for PT Re-Evaluation 09/20/16   PT Start Time 1430   PT Stop Time 1515   PT Time Calculation (min) 45 min   Activity Tolerance Patient tolerated treatment well   Behavior [facility] Therapy [facility] for tasks assessed/performed      Past Medical History:  Diagnosis Date  . Chronic kidney disease    KIDNEY STONES  . GERD (gastroesophageal reflux disease)   . Seizures (HCC)    LAST SEIZURE IN 2015  . Sleep apnea with use of continuous positive airway pressure (CPAP)     Past Surgical History:  Procedure Laterality Date  . ABDOMINAL HYSTERECTOMY    . KNEE ARTHROSCOPY Right 07/24/2015   Procedure: ARTHROSCOPY KNEE partial medial and lateral menisectomy and lateral release;  Surgeon: Kennedy BuckerMichael Menz, MD;  Location: [facility] ORS;  Service: Orthopedics;  Laterality: Right;  . KNEE SURGERY Right   . KNEE SURGERY Right   . WISDOM TOOTH EXTRACTION      There were no vitals filed for this visit.      Subjective Assessment - 09/01/16 1508    Subjective Patient reports she has been having some difficulty with arranging transportation. She reports her neck pain has decreased as has shoulder pain, but she has been unable to participate in her recreational activities like Zumba b/c she still has pain with reaching her arm overhead. She reports having neck pain while doing dishes for 2 minutes, and right before bed.    Limitations Lifting   Patient Stated Goals Would like to be able to carry groceries, participate in her exercise classes.    Currently in Pain? No/denies     Active ROM  flexion initially - 145 degrees. Pain reported only at apex of movement   Cervical rotation WNL (~70 degrees) bilaterally with minor reports of pain  On palpation, patient reports pain in pectoral insertion region, distal clavicle/AC joint   Performed PROM into ER, patient noted to be limited to 30-40 degrees initially with pain at end range. Patient able to relax, increased PROM to 45-55 degrees passively after ~ 10 minutes of intermittent stretching though she continues to report "stretching pain".   Performed grade I-II mobilizations in A-P direction for 30" x 4 bouts on distal clavicle which she reported was a "good hurt"   Passive flexion painless until ~ 145-160 degrees in supine. She is able to achieve roughly the same in supine with active flexion.   UT stretch - 2 sets x 5 for 10" holds bilaterally   Levator stretch 2 sets x 5 for 10" holds bilaterally - reported feeling a "hurts so good" sensation                           PT Education - 09/01/16 1722    Education provided Yes   Education Details Her progress with pain free ROM, HEP updated.    Person(s) Educated Patient   Methods Explanation;Demonstration;Handout   Comprehension Returned demonstration;Verbalized understanding  PT Long Term Goals - 08/09/16 1301      PT LONG TERM GOAL #1   Title Patient will demonstrate at least 165 degrees of abduction and flexion on her LUE with no increase in pain to return to ability to complete ADLs.    Baseline 140 degrees of flexion    Time 6   Period Weeks   Status New     PT LONG TERM GOAL #2   Title Patient will be able to put on a bra without increase in pain to perform ADLs.    Baseline Unable to complete without pain.      PT LONG TERM GOAL #3   Title Patient will report a quickdash score of less than 25% disability to demonstrate improved tolerance for ADLs.    Baseline 47.2%   Time 6   Period Weeks   Status New     PT LONG  TERM GOAL #4   Title Patient will be able to complete exercise routine with no increase in pain to return to her recreationa activities.    Baseline Unable to participate with LUE secondary to pain      PT LONG TERM GOAL #5   Title Patient will be able to carry at least 10# with her LUE to return to functional lifting requirements at home.    Baseline Painw ith holding on to items.    Time 6   Period Weeks   Status New               Plan - 09/01/16 1723    Clinical Impression Statement Patient continues to demonstrate pain with end range shoulder flexion on LUE, reduced symptoms and improved ROM with cervical rotation bilaterally. She continues to be quite limited with passive ER ROM on her LUE which reproduces her pain. She was also noted to have tenderness along distal clavicle with mobilizations. Patient reported sense of relief with levator scap and upper trapezius stretching, though continues to have pain around ~145 degrees of active shoulder flexion (not through lower portions of the ROM).    Rehab Potential Good   Clinical Impairments Affecting Rehab Potential Patient has history of cognitive impairments.   PT Frequency 2x / week   PT Duration 4 weeks   PT Treatment/Interventions Cryotherapy;Electrical Stimulation;Traction;Moist Heat;Neuromuscular re-education;Balance training;Therapeutic exercise;Therapeutic activities;Manual techniques;Taping;Patient/family education;Passive range of motion   PT Next Visit Plan Soft tissue mobilization, cervical distraction/stretching, joint mobilizations to c-spine, GH joint as needed.    Consulted and Agree with Plan of Care Patient      Patient will benefit from skilled therapeutic intervention in order to improve the following deficits and impairments:  Decreased cognition, Impaired perceived functional ability, Pain, Impaired UE functional use, Decreased strength, Decreased range of motion  Visit Diagnosis: Neck pain  Pain in left  upper arm     Problem List There are no active problems to display for this patient.  Kerin RansomPatrick A Torrie Namba, PT, DPT    09/01/2016, 5:25 PM  [place] [facility] [address] [place], [place], [address] Phone: [phone]   Fax:  [phone]  Name: Candace Jordan MRN: [id number] Date of Birth: 02/28/1971

## 2016-09-07 ENCOUNTER — Ambulatory Visit: Payer: No Typology Code available for payment source | Attending: Physician Assistant | Admitting: Physical Therapy

## 2016-09-07 DIAGNOSIS — M542 Cervicalgia: Secondary | ICD-10-CM

## 2016-09-07 DIAGNOSIS — M79622 Pain in left upper arm: Secondary | ICD-10-CM | POA: Diagnosis present

## 2016-09-07 NOTE — Patient Instructions (Signed)
R arm flexion - 165 degrees   L arm flexion -162 degrees   Abduction - 160 degrees bilaterally   STM   Prone horizontal abduction  IR/ER/ABD - MMT   GH superior-inferior joint mobilizations

## 2016-09-07 NOTE — Therapy (Signed)
[place] [facility] [address] [place], [place], [address] Phone: [phone]   Fax:  [phone]  Physical Therapy Treatment  Patient Details  Name: Candace Jordan MRN: [id number] Date of Birth: 06-03-1971 Referring Provider: Van ClinesJon Wolfe  Encounter Date: 09/07/2016      PT End of Session - 09/07/16 1330    Visit Number 3   Number of Visits 9   Date for PT Re-Evaluation 09/20/16   PT Start Time 1145   PT Stop Time 1230   PT Time Calculation (min) 45 min   Activity Tolerance Patient tolerated treatment well   Behavior During Therapy [facility] for tasks assessed/performed      Past Medical History:  Diagnosis Date  . Chronic kidney disease    KIDNEY STONES  . GERD (gastroesophageal reflux disease)   . Seizures (HCC)    LAST SEIZURE IN 2015  . Sleep apnea with use of continuous positive airway pressure (CPAP)     Past Surgical History:  Procedure Laterality Date  . ABDOMINAL HYSTERECTOMY    . KNEE ARTHROSCOPY Right 07/24/2015   Procedure: ARTHROSCOPY KNEE partial medial and lateral menisectomy and lateral release;  Surgeon: Kennedy BuckerMichael Menz, MD;  Location: [facility] ORS;  Service: Orthopedics;  Laterality: Right;  . KNEE SURGERY Right   . KNEE SURGERY Right   . WISDOM TOOTH EXTRACTION      There were no vitals filed for this visit.      Subjective Assessment - 09/07/16 1154    Subjective Patient reports she was doing better, and was able to do her bra (which was not painful for her, but would have been had she gone further. She reports her neck was feeling good too. She slipped and fell in the bathroom on an outstretched arm, had intense pain briefly in her L clavicle area. Reports it has dissipated, but feels a "broken" sensation.    Limitations Lifting   Patient Stated Goals Would like to be able to carry groceries, participate in her exercise classes.    Currently in Pain? No/denies  Not hurting at rest, just at end  range of flexion or with palpation of supraspinatus muscle belly        R arm flexion - 165 degrees   L arm flexion -162 degrees   Abduction - 160 degrees bilaterally   STM -- to supraspinatus muscle belly with reported tender point near cervical junction, improved tolerance for active flexion (no pain at end range now) however pain reported with MMT into abduction   Prone horizontal abduction - 2 sets x 10 at 90 degrees abduction with no pain, attempted at 135 degrees and patient reported severe "breaking" pain. Dissipated when she stopped. No step off deformity of clavicle noted. Mild erythema noted around UT/supraspinatus, superior to clavicle, no bruising noted.   IR/ER/ABD - MMT all 5/5, abduction initially painful (reproductive of her pain), dissipated after joint mobilizations.   GH superior-inferior joint mobilizations 4 bouts of grade II mobilizations x 30" -- no pain with resisted abduction afterwards.                          PT Education - 09/07/16 1329    Education provided Yes   Education Details Progress with ROM, begin strengthening.    Person(s) Educated Patient   Methods Explanation;Demonstration;Handout   Comprehension Returned demonstration;Verbalized understanding             PT Long  Term Goals - 08/09/16 1301      PT LONG TERM GOAL #1   Title Patient will demonstrate at least 165 degrees of abduction and flexion on her LUE with no increase in pain to return to ability to complete ADLs.    Baseline 140 degrees of flexion    Time 6   Period Weeks   Status New     PT LONG TERM GOAL #2   Title Patient will be able to put on a bra without increase in pain to perform ADLs.    Baseline Unable to complete without pain.      PT LONG TERM GOAL #3   Title Patient will report a quickdash score of less than 25% disability to demonstrate improved tolerance for ADLs.    Baseline 47.2%   Time 6   Period Weeks   Status New     PT LONG  TERM GOAL #4   Title Patient will be able to complete exercise routine with no increase in pain to return to her recreationa activities.    Baseline Unable to participate with LUE secondary to pain      PT LONG TERM GOAL #5   Title Patient will be able to carry at least 10# with her LUE to return to functional lifting requirements at home.    Baseline Painw ith holding on to items.    Time 6   Period Weeks   Status New               Plan - 09-24-16 1331    Clinical Impression Statement Patients shoulder AROM has now normalized, though she has had a fall over the weekend which appears to have strained her supraspinatus. She reported reduced symptoms after STM over supraspinatus muscle belly and was able to complete full AROM into flexion and abduction with no increase in pain. She would benefit from additional skilled PT services to address strength deficits.    Rehab Potential Good   Clinical Impairments Affecting Rehab Potential Patient has history of cognitive impairments.   PT Frequency 2x / week   PT Duration 4 weeks   PT Treatment/Interventions Cryotherapy;Electrical Stimulation;Traction;Moist Heat;Neuromuscular re-education;Balance training;Therapeutic exercise;Therapeutic activities;Manual techniques;Taping;Patient/family education;Passive range of motion   PT Next Visit Plan Soft tissue mobilization, cervical distraction/stretching, joint mobilizations to c-spine, GH joint as needed.    Consulted and Agree with Plan of Care Patient      Patient will benefit from skilled therapeutic intervention in order to improve the following deficits and impairments:  Decreased cognition, Impaired perceived functional ability, Pain, Impaired UE functional use, Decreased strength, Decreased range of motion  Visit Diagnosis: Neck pain  Pain in left upper arm       G-Codes - 09-24-2016 1334    Functional Assessment Tool Used Patient report, ROM    Functional Limitation Carrying,  moving and handling objects   Carrying, Moving and Handling Objects Current Status (R6045) At least 1 percent but less than 20 percent impaired, limited or restricted   Carrying, Moving and Handling Objects Goal Status (W0981) 0 percent impaired, limited or restricted      Problem List There are no active problems to display for this patient.  Kerin Ransom, PT, DPT    09/24/16, 1:36 PM  [place] [facility] PHYSICAL AND SPORTS MEDICINE [address] [place], [address] Phone: [phone]   Fax:  [phone]  Name: Candace Jordan MRN: [id number] Date of Birth: 12/08/1970

## 2016-09-13 ENCOUNTER — Encounter: Payer: Self-pay | Admitting: Physical Therapy

## 2016-09-14 ENCOUNTER — Ambulatory Visit: Payer: No Typology Code available for payment source | Admitting: Physical Therapy

## 2016-09-14 DIAGNOSIS — M542 Cervicalgia: Secondary | ICD-10-CM

## 2016-09-14 DIAGNOSIS — M79622 Pain in left upper arm: Secondary | ICD-10-CM

## 2016-09-14 NOTE — Therapy (Signed)
[place] [facility] [address] [place], [place], [address] Phone: [phone]   Fax:  [phone]  Physical Therapy Treatment  Patient Details  Name: Candace SlatesCynthia A Kren MRN: [id number] Date of Birth: [birth year] Referring Provider: Van ClinesJon Wolfe  Encounter Date: 09/14/2016      PT End of Session - 09/14/16 1016    Visit Number 4   Number of Visits 9   Date for PT Re-Evaluation 09/20/16   PT Start Time 0950   PT Stop Time 1030   PT Time Calculation (min) 40 min   Activity Tolerance Patient tolerated treatment well   Behavior [facility] Therapy [facility] tasks assessed/performed      Past Medical History:  Diagnosis Date  . Chronic kidney disease    KIDNEY STONES  . GERD (gastroesophageal reflux disease)   . Seizures (HCC)    LAST SEIZURE IN 2015  . Sleep apnea with use of continuous positive airway pressure (CPAP)     Past Surgical History:  Procedure Laterality Date  . ABDOMINAL HYSTERECTOMY    . KNEE ARTHROSCOPY Right 07/24/2015   Procedure: ARTHROSCOPY KNEE partial medial and lateral menisectomy and lateral release;  Surgeon: Kennedy BuckerMichael Menz, MD;  Location: [facility] ORS;  Service: Orthopedics;  Laterality: Right;  . KNEE SURGERY Right   . KNEE SURGERY Right   . WISDOM TOOTH EXTRACTION      There were no vitals filed for this visit.      Subjective Assessment - 09/14/16 0954    Subjective Patient reports she has had tightness and soreness in the lower posterior part of her neck. Reports she saw her orthopedic who recommended more PT, will do an MRI of her shoulder in 3 weeks if she is not "better".    Limitations Lifting   Patient Stated Goals Would like to be able to carry groceries, participate in her exercise classes.    Currently in Pain? --  Patient reports she has been feeling more stiffness in the posterior part of her lower left neck   Pain Location Neck   Pain Orientation Left;Lower   Pain Descriptors /  Indicators Tightness   Pain Type Chronic pain   Pain Onset More than a month ago   Pain Frequency Intermittent      AROM flexion of shoulder 165 degrees on L shoulder   Rotation to L -90 degrees, to R - 79 degrees and stiff   R rotation over pressure -- x 8 per side for 2 sets (felt stretch, but not pain)   Flexion over pressure x8  for 2 sets (felt relieving sense of stretch)  Seated theraband rows with red t-band x 20 for 2 sets (cuing for increased scapular retraction, less biceps involvement).   Standing Low Rows x 15 for 2 sets with red t-band. (appropriate technique noted)                           PT Education - 09/14/16 1016    Education provided Yes   Education Details Informed to complete new HEP, will likely not need much more therapy.   Person(s) Educated Patient   Methods Explanation;Demonstration;Handout   Comprehension Verbalized understanding;Returned demonstration             PT Long Term Goals - 08/09/16 1301      PT LONG TERM GOAL #1   Title Patient will demonstrate at least 165 degrees of abduction and flexion on  her LUE with no increase in pain to return to ability to complete ADLs.    Baseline 140 degrees of flexion    Time 6   Period Weeks   Status New     PT LONG TERM GOAL #2   Title Patient will be able to put on a bra without increase in pain to perform ADLs.    Baseline Unable to complete without pain.      PT LONG TERM GOAL #3   Title Patient will report a quickdash score of less than 25% disability to demonstrate improved tolerance for ADLs.    Baseline 47.2%   Time 6   Period Weeks   Status New     PT LONG TERM GOAL #4   Title Patient will be able to complete exercise routine with no increase in pain to return to her recreationa activities.    Baseline Unable to participate with LUE secondary to pain      PT LONG TERM GOAL #5   Title Patient will be able to carry at least 10# with her LUE to return to  functional lifting requirements at home.    Baseline Painw ith holding on to items.    Time 6   Period Weeks   Status New               Plan - 09/14/16 1017    Clinical Impression Statement Patient demonstrates full AROM in cervical spine and L shoulder with minimal complaints of tightness/pain after this session. She was initially limited with R rotation, however after stretching with overpressure this was reduced. She reports feeling roughly back to her normal after stretching was completed.    Rehab Potential Good   Clinical Impairments Affecting Rehab Potential Patient has history of cognitive impairments.   PT Frequency 2x / week   PT Duration 4 weeks   PT Treatment/Interventions Cryotherapy;Electrical Stimulation;Traction;Moist Heat;Neuromuscular re-education;Balance training;Therapeutic exercise;Therapeutic activities;Manual techniques;Taping;Patient/family education;Passive range of motion   PT Next Visit Plan Soft tissue mobilization, cervical distraction/stretching, joint mobilizations to c-spine, GH joint as needed.    Consulted and Agree with Plan of Care Patient      Patient will benefit from skilled therapeutic intervention in order to improve the following deficits and impairments:  Decreased cognition, Impaired perceived functional ability, Pain, Impaired UE functional use, Decreased strength, Decreased range of motion  Visit Diagnosis: Neck pain  Pain in left upper arm     Problem List There are no active problems to display for this patient.  Kerin Ransom, PT, DPT    09/14/2016, 11:14 AM  [place] [facility] PHYSICAL AND SPORTS MEDICINE [address] [place], [address] Phone: [phone]   Fax:  [phone]  Name: Candace Jordan MRN: [id number] Date of Birth: May 05, 1971

## 2016-09-14 NOTE — Patient Instructions (Addendum)
AROM flexion of shoulder 165 degrees on L shoulder   Rotation to L -90 degrees, to R - 79 degrees and stiff   R rotation over pressure   Flexion over pressure   Seated theraband rows with red t-band x 20 for 2 sets

## 2016-09-16 ENCOUNTER — Encounter: Payer: Medicare Other | Admitting: Physical Therapy

## 2016-09-22 ENCOUNTER — Encounter: Payer: Medicare Other | Admitting: Physical Therapy

## 2016-09-23 ENCOUNTER — Ambulatory Visit: Payer: No Typology Code available for payment source | Admitting: Physical Therapy

## 2016-09-27 ENCOUNTER — Encounter: Payer: Medicare Other | Admitting: Physical Therapy

## 2016-09-30 ENCOUNTER — Encounter: Payer: Medicare Other | Admitting: Physical Therapy

## 2016-10-04 ENCOUNTER — Encounter: Payer: Medicare Other | Admitting: Physical Therapy

## 2016-10-07 ENCOUNTER — Encounter: Payer: Self-pay | Admitting: Physical Therapy

## 2016-10-11 ENCOUNTER — Encounter: Payer: Self-pay | Admitting: Physical Therapy

## 2016-10-14 ENCOUNTER — Encounter: Payer: Self-pay | Admitting: Physical Therapy

## 2017-04-27 ENCOUNTER — Other Ambulatory Visit: Payer: Self-pay | Admitting: Family Medicine

## 2017-04-27 DIAGNOSIS — R591 Generalized enlarged lymph nodes: Secondary | ICD-10-CM

## 2017-05-13 ENCOUNTER — Ambulatory Visit
Admission: RE | Admit: 2017-05-13 | Discharge: 2017-05-13 | Disposition: A | Discharge status: Home / Self Care | Payer: Medicare Other | Source: Ambulatory Visit | Attending: Family Medicine | Admitting: Family Medicine

## 2017-05-13 DIAGNOSIS — R591 Generalized enlarged lymph nodes: Secondary | ICD-10-CM | POA: Insufficient documentation

## 2017-05-13 DIAGNOSIS — R918 Other nonspecific abnormal finding of lung field: Secondary | ICD-10-CM | POA: Diagnosis not present

## 2018-05-11 ENCOUNTER — Other Ambulatory Visit: Payer: Self-pay | Admitting: Orthopedic Surgery

## 2018-05-11 DIAGNOSIS — M47816 Spondylosis without myelopathy or radiculopathy, lumbar region: Secondary | ICD-10-CM

## 2018-05-11 DIAGNOSIS — M5416 Radiculopathy, lumbar region: Secondary | ICD-10-CM

## 2018-05-24 ENCOUNTER — Ambulatory Visit
Admission: RE | Admit: 2018-05-24 | Discharge: 2018-05-24 | Disposition: A | Discharge status: Home / Self Care | Payer: Medicare Other | Source: Ambulatory Visit | Attending: Orthopedic Surgery | Admitting: Orthopedic Surgery

## 2018-05-24 DIAGNOSIS — M4726 Other spondylosis with radiculopathy, lumbar region: Secondary | ICD-10-CM | POA: Diagnosis not present

## 2018-05-24 DIAGNOSIS — M4807 Spinal stenosis, lumbosacral region: Secondary | ICD-10-CM | POA: Insufficient documentation

## 2018-05-24 DIAGNOSIS — M47816 Spondylosis without myelopathy or radiculopathy, lumbar region: Secondary | ICD-10-CM

## 2018-05-24 DIAGNOSIS — M5416 Radiculopathy, lumbar region: Secondary | ICD-10-CM | POA: Diagnosis present

## 2018-05-24 DIAGNOSIS — M5137 Other intervertebral disc degeneration, lumbosacral region: Secondary | ICD-10-CM | POA: Diagnosis not present

## 2018-09-24 ENCOUNTER — Emergency Department
Admission: EM | Admit: 2018-09-24 | Discharge: 2018-09-24 | Disposition: A | Discharge status: Home / Self Care | Payer: Medicare Other | Attending: Emergency Medicine | Admitting: Emergency Medicine

## 2018-09-24 ENCOUNTER — Emergency Department: Payer: Medicare Other

## 2018-09-24 ENCOUNTER — Encounter: Payer: Self-pay | Admitting: Emergency Medicine

## 2018-09-24 DIAGNOSIS — Y9301 Activity, walking, marching and hiking: Secondary | ICD-10-CM | POA: Diagnosis not present

## 2018-09-24 DIAGNOSIS — N189 Chronic kidney disease, unspecified: Secondary | ICD-10-CM | POA: Insufficient documentation

## 2018-09-24 DIAGNOSIS — M25511 Pain in right shoulder: Secondary | ICD-10-CM | POA: Insufficient documentation

## 2018-09-24 DIAGNOSIS — S161XXA Strain of muscle, fascia and tendon at neck level, initial encounter: Secondary | ICD-10-CM | POA: Diagnosis not present

## 2018-09-24 DIAGNOSIS — W109XXA Fall (on) (from) unspecified stairs and steps, initial encounter: Secondary | ICD-10-CM | POA: Insufficient documentation

## 2018-09-24 DIAGNOSIS — Y92009 Unspecified place in unspecified non-institutional (private) residence as the place of occurrence of the external cause: Secondary | ICD-10-CM | POA: Diagnosis not present

## 2018-09-24 DIAGNOSIS — Z79899 Other long term (current) drug therapy: Secondary | ICD-10-CM | POA: Diagnosis not present

## 2018-09-24 DIAGNOSIS — F17210 Nicotine dependence, cigarettes, uncomplicated: Secondary | ICD-10-CM | POA: Insufficient documentation

## 2018-09-24 DIAGNOSIS — Y999 Unspecified external cause status: Secondary | ICD-10-CM | POA: Insufficient documentation

## 2018-09-24 DIAGNOSIS — S199XXA Unspecified injury of neck, initial encounter: Secondary | ICD-10-CM | POA: Diagnosis present

## 2018-09-24 DIAGNOSIS — R4020 Unspecified coma: Secondary | ICD-10-CM | POA: Diagnosis not present

## 2018-09-24 NOTE — ED Provider Notes (Addendum)
[facility] Emergency Department Provider Note ____________________________________________   First MD Initiated Contact with Patient 09/24/18 1247     (approximate)  I have reviewed the triage vital signs and the nursing notes.  HISTORY  Chief Complaint Shoulder Pain  HPI Candace Jordan is a 48 y.o. female a of seizure disorder, developmental delay, chronic low back pain   Patient reports that Friday she was at a friend's doing laundry, she leaned over to pick up her dog to carry down the stairs and she did so she fell forward.  She woke up at the bottom of the stairs with her friend when coming out.  She reports she pretty sure she leaned forward, has poor balance, fell forward and passed out briefly on the steps.  She remembers her friend coming, does not think she had a seizure.  Did not bite her tongue or urinate herself.  She reports since then she been having pain over the right side of the neck, and radiates down across her shoulder and into her right shoulder region.  No shortness of breath.  No rib pain except for a little bit of discomfort over the right side after the fall which is now gone.  No chest pain.  No shortness of breath or recent illness.  She does take oxycodone, has a pain medicine appointment tomorrow, reports she took that this morning, and does not wish for any more pain medicine until 2:30 PM.  Pain is currently mild to moderate at rest, but if she turns her neck to the side or tries to raise her right shoulder she has pain especially up over the top of her right shoulder  No numbness or tingling.  No weakness.  Did vomit once after the injury but feels fine now.  No headache.  Does not take any blood thinners.  Past Medical History:  Diagnosis Date  . Chronic kidney disease    KIDNEY STONES  . GERD (gastroesophageal reflux disease)   . Seizures (HCC)    LAST SEIZURE IN 2015  . Sleep apnea with use of continuous positive airway  pressure (CPAP)     There are no active problems to display for this patient.   Past Surgical History:  Procedure Laterality Date  . ABDOMINAL HYSTERECTOMY    . KNEE ARTHROSCOPY Right 07/24/2015   Procedure: ARTHROSCOPY KNEE partial medial and lateral menisectomy and lateral release;  Surgeon: Kennedy Bucker, MD;  Location: [facility] ORS;  Service: Orthopedics;  Laterality: Right;  . KNEE SURGERY Right   . KNEE SURGERY Right   . WISDOM TOOTH EXTRACTION      Prior to Admission medications   Medication Sig Start Date End Date Taking? Authorizing Provider  cetirizine (ZYRTEC) 10 MG tablet Take 10 mg by mouth daily.   Yes [provider]  gabapentin (NEURONTIN) 300 MG capsule Take 1 capsule (300 mg total) by mouth 3 (three) times daily. Patient taking differently: Take 300 mg by mouth 3 (three) times daily. Take 2 capsule (600 mg) in the morning, 1 capsule (300 mg) at noon, 3 capsules (900 mg) at night 03/08/15  Yes Darci Current, MD  levETIRAcetam (KEPPRA) 250 MG tablet 500 mg every morning and 1000mg  every night 03/08/15  Yes Darci Current, MD  omeprazole (PRILOSEC) 20 MG capsule Take 20 mg by mouth every morning.   Yes [provider]  Oxycodone HCl 10 MG TABS Take 10 mg by mouth every 6 (six) hours as needed. 08/25/18  Yes [provider]    Allergies Topiramate; Meloxicam; Sulfa antibiotics; and Penicillins  History reviewed. No pertinent family history.  Social History Social History   Tobacco Use  . Smoking status: Current Every Day Smoker    Packs/day: 1.00    Years: 28.00    Pack years: 28.00    Types: Cigarettes  . Smokeless tobacco: Never Used  Substance Use Topics  . Alcohol use: No  . Drug use: Never    Review of Systems Constitutional: No fever/chills Eyes: No visual changes. ENT: No sore throat.  See HPI regarding neck pain.  No pain in the middle the neck or the left side, pain is all over the right side is like her muscles are very  sore. Cardiovascular: Denies chest pain. Respiratory: Denies shortness of breath. Gastrointestinal: No abdominal pain.   Genitourinary: Negative for dysuria. Musculoskeletal: Negative for back pain. Skin: Negative for rash. Neurological: Negative for headaches, areas of focal weakness or numbness.    ____________________________________________   PHYSICAL EXAM:  VITAL SIGNS: ED Triage Vitals  Enc Vitals Group     BP 09/24/18 1141 133/86     Pulse Rate 09/24/18 1141 75     Resp 09/24/18 1141 18     Temp --      Temp src --      SpO2 09/24/18 1141 100 %     Weight 09/24/18 1142 238 lb (108 kg)     Height 09/24/18 1142 5\' 7"  (1.702 m)     Head Circumference --      Peak Flow --      Pain Score 09/24/18 1142 5     Pain Loc --      Pain Edu? --      Excl. in GC? --     Constitutional: Alert and oriented. Well appearing and in no acute distress. Eyes: Conjunctivae are normal. Head: Atraumatic. Nose: No congestion/rhinnorhea. Mouth/Throat: Mucous membranes are moist. Neck: No stridor.  Cardiovascular: Normal rate, regular rhythm. Grossly normal heart sounds.  Good peripheral circulation. Respiratory: Normal respiratory effort.  No retractions. Lungs CTAB. Gastrointestinal: Soft and nontender. No distention. Musculoskeletal:  RIGHT Right upper extremity demonstrates normal strength, good use of all muscles but some limitation due to pain with especially abduction region of the right shoulder. No edema bruising or contusions of the right shoulder/upper arm, right elbow, right forearm / hand.  Tenderness to palpation across the trapezius, right paraspinous region, and also over the superior portion of the right shoulder joint without any evidence of obvious trauma bruising or ecchymosis. Strong radial pulse. Intact median/ulnar/radial neuro-muscular exam.  LEFT Left upper extremity demonstrates normal strength, good use of all muscles. No edema bruising or contusions of the  left shoulder/upper arm, left elbow, left forearm / hand. Full range of motion of the left  upper extremity without pain. No evidence of trauma. Strong radial pulse. Intact median/ulnar/radial neuro-muscular exam.   Neurologic:  Normal speech and language. No gross focal neurologic deficits are appreciated.  Skin:  Skin is warm, dry and intact. No rash noted. Psychiatric: Mood and affect are normal. Speech and behavior are normal.  ____________________________________________   LABS (all labs ordered are listed, but only abnormal results are displayed)  Labs Reviewed - No data to display ____________________________________________  EKG  Reviewed enterotomy at 1152 Heart rate 80 QRS 120 QTc 470 Left bundle branch block without evidence ischemia. ____________________________________________  RADIOLOGY  Dg Shoulder Right  Result Date: 09/24/2018 CLINICAL DATA:  Status post  fall 2 days ago. EXAM: RIGHT SHOULDER - 2+ VIEW COMPARISON:  None. FINDINGS: There is subtle cortical irregularity involving the distal clavicle which may reflect a subtle nondisplaced fracture. There is no other fracture or dislocation. There is no evidence of arthropathy or other focal bone abnormality. Soft tissues are unremarkable. IMPRESSION: Subtle cortical irregularity involving the distal clavicle which may reflect a subtle nondisplaced fracture. Recommend a dedicated x-ray of right clavicle. Electronically Signed   By: Elige Ko   On: 09/24/2018 13:12   Ct Head Wo Contrast  Result Date: 09/24/2018 CLINICAL DATA:  Pain following fall EXAM: CT HEAD WITHOUT CONTRAST CT CERVICAL SPINE WITHOUT CONTRAST TECHNIQUE: Multidetector CT imaging of the head and cervical spine was performed following the standard protocol without intravenous contrast. Multiplanar CT image reconstructions of the cervical spine were also generated. COMPARISON:  Head CT April 10, 2016 FINDINGS: CT HEAD FINDINGS Brain: The ventricles are  normal in size and configuration. There is no intracranial mass, hemorrhage, extra-axial fluid collection, or midline shift. The brain parenchyma appears unremarkable. No acute infarct evident. Vascular: No hyperdense vessel. There is no appreciable vascular calcification. Skull: The bony calvarium appears intact. Sinuses/Orbits: There is opacification in a posterior ethmoid air cell on the right. Other visualized paranasal sinuses are clear. Visualized orbits appear symmetric bilaterally. Other: Mastoids on the right are clear. There is chronic opacification of left-sided mastoid air cells. There is also chronic opacification of a portion of the left middle ear. CT CERVICAL SPINE FINDINGS Alignment: There is no appreciable spondylolisthesis. There is reversal of lordosis. Skull base and vertebrae: Skull base and craniocervical junction regions appear normal. No fracture is demonstrable. There are no blastic or lytic bone lesions. Soft tissues and spinal canal: Prevertebral soft tissues and predental space regions are normal. There is no cord or canal hematoma. No paraspinous lesions evident. Disc levels: There is moderately severe disc space narrowing at C5-6 and C6-7 with vacuum phenomenon at these levels. Other disc spaces appear unremarkable. There are small anterior osteophytes at C 5 and C6. There is facet hypertrophy at several levels. There is mild exit foraminal narrowing at several levels. There is no disc extrusion or stenosis. No nerve root effacement is appreciable. Upper chest: Visualized upper lung zones are clear. Other: None IMPRESSION: CT head: Chronic left-sided mastoid opacification. There is also chronic opacification in a portion of the left middle ear. Opacification is noted in a posterior right ethmoid air cell. Study otherwise unremarkable. CT cervical spine: No fracture or spondylolisthesis. There is osteoarthritic change at several levels. No disc extrusion or stenosis. Reversal of  lordotic curvature may indicate chronic muscle spasm. Electronically Signed   By: Bretta Bang III M.D.   On: 09/24/2018 12:58   Ct Cervical Spine Wo Contrast  Result Date: 09/24/2018 CLINICAL DATA:  Pain following fall EXAM: CT HEAD WITHOUT CONTRAST CT CERVICAL SPINE WITHOUT CONTRAST TECHNIQUE: Multidetector CT imaging of the head and cervical spine was performed following the standard protocol without intravenous contrast. Multiplanar CT image reconstructions of the cervical spine were also generated. COMPARISON:  Head CT April 10, 2016 FINDINGS: CT HEAD FINDINGS Brain: The ventricles are normal in size and configuration. There is no intracranial mass, hemorrhage, extra-axial fluid collection, or midline shift. The brain parenchyma appears unremarkable. No acute infarct evident. Vascular: No hyperdense vessel. There is no appreciable vascular calcification. Skull: The bony calvarium appears intact. Sinuses/Orbits: There is opacification in a posterior ethmoid air cell on the right. Other visualized paranasal sinuses are  clear. Visualized orbits appear symmetric bilaterally. Other: Mastoids on the right are clear. There is chronic opacification of left-sided mastoid air cells. There is also chronic opacification of a portion of the left middle ear. CT CERVICAL SPINE FINDINGS Alignment: There is no appreciable spondylolisthesis. There is reversal of lordosis. Skull base and vertebrae: Skull base and craniocervical junction regions appear normal. No fracture is demonstrable. There are no blastic or lytic bone lesions. Soft tissues and spinal canal: Prevertebral soft tissues and predental space regions are normal. There is no cord or canal hematoma. No paraspinous lesions evident. Disc levels: There is moderately severe disc space narrowing at C5-6 and C6-7 with vacuum phenomenon at these levels. Other disc spaces appear unremarkable. There are small anterior osteophytes at C 5 and C6. There is facet  hypertrophy at several levels. There is mild exit foraminal narrowing at several levels. There is no disc extrusion or stenosis. No nerve root effacement is appreciable. Upper chest: Visualized upper lung zones are clear. Other: None IMPRESSION: CT head: Chronic left-sided mastoid opacification. There is also chronic opacification in a portion of the left middle ear. Opacification is noted in a posterior right ethmoid air cell. Study otherwise unremarkable. CT cervical spine: No fracture or spondylolisthesis. There is osteoarthritic change at several levels. No disc extrusion or stenosis. Reversal of lordotic curvature may indicate chronic muscle spasm. Electronically Signed   By: Bretta Bang III M.D.   On: 09/24/2018 12:58     Reviewed negative for acute intracranial cervical injury. Given the patient's clinical history and examination suspect likely does have a slight distal right clavicle fracture. ____________________________________________   PROCEDURES  Procedure(s) performed: None  Procedures  Critical Care performed: No  ____________________________________________   INITIAL IMPRESSION / ASSESSMENT AND PLAN / ED COURSE  Pertinent labs & imaging results that were available during my care of the patient were reviewed by me and considered in my medical decision making (see chart for details).   Patient was offered blood work, does have a history of chronic kidney disease but was shared medical decision making she would really just like to have her x-ray reports done.  She does not wish for any pain medication, has follow-up with her pain doctor tomorrow.  She reports she is worried that she may need an MRI of her right shoulder if this does not get better.  She is worried she could have a rotator cuff injury.  I suspect that this could be true, hard to evaluate for obvious rotator injury at this time, but does have tenderness along the right paraspinous region without midline  cervical.  Suspect likely musculoskeletal strain, possible shoulder injury.  Reassuring clinical exam.  Clinically no evidence of major trauma, intracranial hemorrhage, cervical spine injury, or significant chest trauma.  No associated cardiac symptoms.  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      ----------------------------------------- 2:22 PM on 09/24/2018 -----------------------------------------  Patient resting comfortably, discussed CT findings.  I suspect with the patient that we talked that she may have a collarbone fracture.  She will wear a sling when up out of bed, limit use of the right upper extremity and follow-up with orthopedics.  Has follow-up with her doctor tomorrow as well.  Patient resting comfortably no distress.  Return precautions and treatment recommendations and follow-up discussed with the patient who is agreeable with the plan.  ____________________________________________   FINAL CLINICAL IMPRESSION(S) / ED DIAGNOSES  Final diagnoses:  Acute pain of right shoulder  Strain of neck muscle, initial encounter        Note:  This document was prepared using Dragon voice recognition software and may include unintentional dictation errors       Sharyn Creamer, MD 09/24/18 1423    Sharyn Creamer, MD 09/24/18 1426

## 2018-09-24 NOTE — ED Notes (Signed)
Pt refusing bloodwork at this time

## 2018-09-24 NOTE — ED Triage Notes (Signed)
Pt to ED from home c/o right shoulder pain after fall on Friday.  States blacked out and vomited once.  States hx of balance problems.  Was told to come here from urgent care for further evaluation.  Pt A&Ox4, ambulatory with steady gait. States took a 10mg  oxycodone at 0930 this AM.

## 2018-09-28 MED ORDER — CLINDAMYCIN PHOSPHATE 300 MG/50ML IV SOLN
INTRAVENOUS | Status: AC
  Filled 2018-09-28: qty 50

## 2018-12-14 ENCOUNTER — Other Ambulatory Visit: Payer: Self-pay | Admitting: Neurology

## 2018-12-14 DIAGNOSIS — R569 Unspecified convulsions: Secondary | ICD-10-CM

## 2019-01-22 ENCOUNTER — Ambulatory Visit: Admission: RE | Admit: 2019-01-22 | Payer: Medicare HMO | Source: Ambulatory Visit

## 2019-02-05 ENCOUNTER — Ambulatory Visit: Admission: RE | Admit: 2019-02-05 | Payer: Medicare Other | Source: Ambulatory Visit

## 2019-02-05 ENCOUNTER — Other Ambulatory Visit: Payer: Self-pay

## 2019-02-05 ENCOUNTER — Ambulatory Visit
Admission: RE | Admit: 2019-02-05 | Discharge: 2019-02-05 | Disposition: A | Discharge status: Home / Self Care | Payer: Medicare HMO | Source: Ambulatory Visit | Attending: Neurology | Admitting: Neurology

## 2019-02-05 DIAGNOSIS — R569 Unspecified convulsions: Secondary | ICD-10-CM | POA: Diagnosis present

## 2020-02-15 ENCOUNTER — Other Ambulatory Visit: Payer: Self-pay | Admitting: Internal Medicine

## 2020-02-15 DIAGNOSIS — Z1231 Encounter for screening mammogram for malignant neoplasm of breast: Secondary | ICD-10-CM

## 2021-02-18 DIAGNOSIS — M545 Low back pain, unspecified: Secondary | ICD-10-CM | POA: Diagnosis not present

## 2021-02-18 DIAGNOSIS — F1120 Opioid dependence, uncomplicated: Secondary | ICD-10-CM | POA: Diagnosis not present

## 2021-02-18 DIAGNOSIS — G894 Chronic pain syndrome: Secondary | ICD-10-CM | POA: Diagnosis not present

## 2021-02-18 DIAGNOSIS — M791 Myalgia, unspecified site: Secondary | ICD-10-CM | POA: Diagnosis not present

## 2021-02-18 DIAGNOSIS — M5440 Lumbago with sciatica, unspecified side: Secondary | ICD-10-CM | POA: Diagnosis not present

## 2021-02-18 DIAGNOSIS — G8929 Other chronic pain: Secondary | ICD-10-CM | POA: Diagnosis not present

## 2021-02-18 DIAGNOSIS — F809 Developmental disorder of speech and language, unspecified: Secondary | ICD-10-CM | POA: Diagnosis not present

## 2021-02-18 DIAGNOSIS — M797 Fibromyalgia: Secondary | ICD-10-CM | POA: Diagnosis not present

## 2021-03-09 ENCOUNTER — Emergency Department
Admission: EM | Admit: 2021-03-09 | Discharge: 2021-03-10 | Disposition: A | Discharge status: Home / Self Care | Payer: Medicare HMO | Attending: Emergency Medicine | Admitting: Emergency Medicine

## 2021-03-09 ENCOUNTER — Emergency Department: Payer: Medicare HMO

## 2021-03-09 DIAGNOSIS — R112 Nausea with vomiting, unspecified: Secondary | ICD-10-CM | POA: Diagnosis not present

## 2021-03-09 DIAGNOSIS — R109 Unspecified abdominal pain: Secondary | ICD-10-CM | POA: Diagnosis not present

## 2021-03-09 DIAGNOSIS — R079 Chest pain, unspecified: Secondary | ICD-10-CM | POA: Diagnosis not present

## 2021-03-09 DIAGNOSIS — R0789 Other chest pain: Secondary | ICD-10-CM | POA: Diagnosis not present

## 2021-03-09 DIAGNOSIS — R1010 Upper abdominal pain, unspecified: Secondary | ICD-10-CM | POA: Diagnosis not present

## 2021-03-09 DIAGNOSIS — N189 Chronic kidney disease, unspecified: Secondary | ICD-10-CM | POA: Diagnosis not present

## 2021-03-09 DIAGNOSIS — F17210 Nicotine dependence, cigarettes, uncomplicated: Secondary | ICD-10-CM | POA: Insufficient documentation

## 2021-03-09 DIAGNOSIS — Z20822 Contact with and (suspected) exposure to covid-19: Secondary | ICD-10-CM | POA: Insufficient documentation

## 2021-03-09 DIAGNOSIS — K219 Gastro-esophageal reflux disease without esophagitis: Secondary | ICD-10-CM | POA: Diagnosis not present

## 2021-03-09 DIAGNOSIS — R110 Nausea: Secondary | ICD-10-CM | POA: Diagnosis not present

## 2021-03-09 DIAGNOSIS — R1013 Epigastric pain: Secondary | ICD-10-CM | POA: Diagnosis not present

## 2021-03-09 DIAGNOSIS — I959 Hypotension, unspecified: Secondary | ICD-10-CM | POA: Diagnosis not present

## 2021-03-09 DIAGNOSIS — R1011 Right upper quadrant pain: Secondary | ICD-10-CM | POA: Diagnosis not present

## 2021-03-09 LAB — COMPREHENSIVE METABOLIC PANEL
ALT: 25 U/L (ref 0–44)
AST: 21 U/L (ref 15–41)
Albumin: 4.1 g/dL (ref 3.5–5.0)
Alkaline Phosphatase: 63 U/L (ref 38–126)
Anion gap: 12 (ref 5–15)
BUN: 10 mg/dL (ref 6–20)
CO2: 21 mmol/L — ABNORMAL LOW (ref 22–32)
Calcium: 9 mg/dL (ref 8.9–10.3)
Chloride: 102 mmol/L (ref 98–111)
Creatinine, Ser: 0.95 mg/dL (ref 0.44–1.00)
GFR, Estimated: >60 mL/min (ref >60)
Glucose, Bld: 130 mg/dL — ABNORMAL HIGH (ref 70–99)
Potassium: 4.3 mmol/L (ref 3.5–5.1)
Sodium: 135 mmol/L (ref 135–145)
Total Bilirubin: 0.8 mg/dL (ref 0.3–1.2)
Total Protein: 7.5 g/dL (ref 6.5–8.1)

## 2021-03-09 LAB — CBC
HCT: 39.4 % (ref 36.0–46.0)
Hemoglobin: 13.6 g/dL (ref 12.0–15.0)
MCH: 33.9 pg (ref 26.0–34.0)
MCHC: 34.5 g/dL (ref 30.0–36.0)
MCV: 98.3 fL (ref 80.0–100.0)
Platelets: 275 10*3/uL (ref 150–400)
RBC: 4.01 MIL/uL (ref 3.87–5.11)
RDW: 12 % (ref 11.5–15.5)
WBC: 12.8 10*3/uL — ABNORMAL HIGH (ref 4.0–10.5)
nRBC: 0 % (ref 0.0–0.2)

## 2021-03-09 LAB — LIPASE, BLOOD: Lipase: 39 U/L (ref 11–51)

## 2021-03-09 LAB — TROPONIN I (HIGH SENSITIVITY): Troponin I (High Sensitivity): 6 ng/L (ref <18)

## 2021-03-09 MED ORDER — LEVETIRACETAM IN NACL 1000 MG/100ML IV SOLN
1000.0000 mg | Freq: Once | INTRAVENOUS | Status: AC
  Administered 2021-03-09: 1000 mg via INTRAVENOUS
  Filled 2021-03-09: qty 100

## 2021-03-09 MED ORDER — MORPHINE SULFATE (PF) 2 MG/ML IV SOLN
2.0000 mg | Freq: Once | INTRAVENOUS | Status: AC
  Administered 2021-03-09: 2 mg via INTRAVENOUS

## 2021-03-09 MED ORDER — MORPHINE SULFATE (PF) 4 MG/ML IV SOLN
4.0000 mg | Freq: Once | INTRAVENOUS | Status: AC
  Administered 2021-03-09: 4 mg via INTRAVENOUS
  Filled 2021-03-09: qty 1

## 2021-03-09 MED ORDER — SODIUM CHLORIDE 0.9 % IV BOLUS
1000.0000 mL | Freq: Once | INTRAVENOUS | Status: AC
  Administered 2021-03-09: 1000 mL via INTRAVENOUS

## 2021-03-09 MED ORDER — ONDANSETRON HCL 4 MG/2ML IJ SOLN
4.0000 mg | INTRAMUSCULAR | Status: AC
  Administered 2021-03-09: 4 mg via INTRAVENOUS
  Filled 2021-03-09: qty 2

## 2021-03-09 MED ORDER — MORPHINE SULFATE (PF) 2 MG/ML IV SOLN
2.0000 mg | Freq: Once | INTRAVENOUS | Status: DC
  Filled 2021-03-09: qty 1

## 2021-03-09 MED ORDER — LEVETIRACETAM IN NACL 500 MG/100ML IV SOLN: 500.0000 mg | Freq: Once | INTRAVENOUS | Status: DC

## 2021-03-09 NOTE — ED Triage Notes (Signed)
Pt states she was just having right sided flank pain and radiates transverse to her epigastric area. Pt stats that she has some N/V also that is yellow and brown. Pain is reproducible with  Palpation .  Pt appears stable , vitals as noted in chart   ABCS appear stable   Pain 9/10

## 2021-03-09 NOTE — ED Provider Notes (Signed)
[facility] Emergency Department Provider Note   ____________________________________________   Event Date/Time   First MD Initiated Contact with Patient 03/09/21 2055     (approximate)  I have reviewed the triage vital signs and the nursing notes.   HISTORY  Chief Complaint Chest Pain, Back Pain, and Abdominal Pain    HPI Candace Jordan is a 50 y.o. female here for evaluation of pretty severe nausea and vomiting and right upper abdominal pain  Patient reports for 2 days now she had a persistent pain in her right upper to mid abdomen.  Radiates discomfort towards her right flank.  No pain or burning with urination.  No fever.  Reports nausea and vomiting.  She reports she is not able to keep some of her seizure medications or her oxycodone down over the last day due to the unremitting nature of the nausea.  She reports she has been laying about in pain for essentially 2 straight days now.  Previous abdominal surgeries include tubal ligation and she reports a total hysterectomy.  No pain in the lower abdomen.  No pain in the back.  Pain is located primarily in the mid to right upper abdomen.  Does not use any alcohol.  No bloody emesis.  Past Medical History:  Diagnosis Date   Chronic kidney disease    KIDNEY STONES   GERD (gastroesophageal reflux disease)    Seizures (HCC)    LAST SEIZURE IN 2015   Sleep apnea with use of continuous positive airway pressure (CPAP)     There are no problems to display for this patient.   Past Surgical History:  Procedure Laterality Date   ABDOMINAL HYSTERECTOMY     KNEE ARTHROSCOPY Right 07/24/2015   Procedure: ARTHROSCOPY KNEE partial medial and lateral menisectomy and lateral release;  Surgeon: Kennedy Bucker, MD;  Location: [facility] ORS;  Service: Orthopedics;  Laterality: Right;   KNEE SURGERY Right    KNEE SURGERY Right    WISDOM TOOTH EXTRACTION      Prior to Admission medications   Medication Sig Start  Date End Date Taking? Authorizing Provider  cetirizine (ZYRTEC) 10 MG tablet Take 10 mg by mouth daily.    [provider]  gabapentin (NEURONTIN) 300 MG capsule Take 1 capsule (300 mg total) by mouth 3 (three) times daily. Patient taking differently: Take 300 mg by mouth 3 (three) times daily. Take 2 capsule (600 mg) in the morning, 1 capsule (300 mg) at noon, 3 capsules (900 mg) at night 03/08/15   Darci Current, MD  levETIRAcetam (KEPPRA) 250 MG tablet 500 mg every morning and 1000mg  every night 03/08/15   05/09/15, MD  omeprazole (PRILOSEC) 20 MG capsule Take 20 mg by mouth every morning.    [provider]  Oxycodone HCl 10 MG TABS Take 10 mg by mouth every 6 (six) hours as needed. 08/25/18   [provider]    Allergies Topiramate, Meloxicam, Sulfa antibiotics, and Penicillins  No family history on file.  Social History Social History   Tobacco Use   Smoking status: Every Day    Packs/day: 1.00    Years: 28.00    Pack years: 28.00    Types: Cigarettes   Smokeless tobacco: Never  Substance Use Topics   Alcohol use: No   Drug use: Never    Review of Systems Constitutional: No fever/chills Eyes: No visual changes. ENT: No sore throat. Cardiovascular: Denies chest pain. Respiratory: Denies shortness of breath. Gastrointestinal:  See HPI Genitourinary: Negative for dysuria. Musculoskeletal: Negative for back pain. Skin: Negative for rash. Neurological: Negative for headaches, areas of focal weakness or numbness.  EMS reported patient could potentially called for chest pain, the patient reports is not really having chest pain seems more right upper abdomen.  ____________________________________________   PHYSICAL EXAM:  VITAL SIGNS: ED Triage Vitals  Enc Vitals Group     BP 03/09/21 2101 (!) 111/92     Pulse Rate 03/09/21 2101 90     Resp 03/09/21 2101 20     Temp 03/09/21 2101 97.9 F (36.6 C)     Temp Source 03/09/21 2101  Oral     SpO2 03/09/21 2101 97 %     Weight 03/09/21 2103 231 lb 7.7 oz (105 kg)     Height 03/09/21 2103 5\' 7"  (1.702 m)     Head Circumference --      Peak Flow --      Pain Score 03/09/21 2102 9     Pain Loc --      Pain Edu? --      Excl. in GC? --     Constitutional: Alert and oriented.  Appears quite nauseated.  Reporting pain in the right upper abdomen holding hand over right mid to right upper abdomen appears quite uncomfortable and in pain.  She does not appear to be in acute extremitas but appears to be in quite a bit of pain Eyes: Conjunctivae are normal. Head: Atraumatic. Nose: No congestion/rhinnorhea. Mouth/Throat: Mucous membranes are moist. Neck: No stridor.  Cardiovascular: Normal rate, regular rhythm. Grossly normal heart sounds.  Good peripheral circulation. Respiratory: Normal respiratory effort.  No retractions. Lungs CTAB. Gastrointestinal: S soft and nontender through the lower abdomen left upper quadrant.  Mild pain in the periumbilical region but significant severe pain with voluntary guarding in the right upper quadrant. Musculoskeletal: No lower extremity tenderness nor edema. Neurologic:  Normal speech and language. No gross focal neurologic deficits are appreciated.  Skin:  Skin is warm, dry and intact. No rash noted. Psychiatric: Mood and affect are normal. Speech and behavior are normal.  ____________________________________________   LABS (all labs ordered are listed, but only abnormal results are displayed)  Labs Reviewed  CBC - Abnormal; Notable for the following components:      Result Value   WBC 12.8 (*)    All other components within normal limits  COMPREHENSIVE METABOLIC PANEL - Abnormal; Notable for the following components:   CO2 21 (*)    Glucose, Bld 130 (*)    All other components within normal limits  LIPASE, BLOOD  URINALYSIS, COMPLETE (UACMP) WITH MICROSCOPIC  TROPONIN I (HIGH SENSITIVITY)    ____________________________________________  EKG  Is reviewed and interpreted by me at 2056 Heart rate 90 Cures 120 QTc 520 Sinus rhythm, left bundle branch block.  No evidence of acute ischemia ____________________________________________  RADIOLOGY  Imaging pending at time of signout to Dr. 2057 who will follow up ____________________________________________   PROCEDURES  Procedure(s) performed: None  Procedures  Critical Care performed: No  ____________________________________________   INITIAL IMPRESSION / ASSESSMENT AND PLAN / ED COURSE  Pertinent labs & imaging results that were available during my care of the patient were reviewed by me and considered in my medical decision making (see chart for details).   Differential diagnosis includes but is not limited to, abdominal perforation, aortic dissection, cholecystitis, appendicitis, diverticulitis, colitis, esophagitis/gastritis, kidney stone, pyelonephritis, urinary tract infection, aortic aneurysm. All are considered in decision and  treatment plan. Based upon the patient's presentation and risk factors, we will proceed first by evaluating for right upper quadrant pathology.  Focal pain primarily in the right upper quadrant with some radiation to the right flank.  Will send a single troponin to evaluate for cardiac etiology but based on the patient's clinical history lack of "chest pain" or respiratory symptoms find ACS or acute pulmonary etiology be extremely unlikely.  Abdomen appears quite tender primarily in the mid to right upper quadrant.    ----------------------------------------- 10:48 PM on 03/09/2021 ----------------------------------------- Patient is resting now more comfortably but still some ongoing mild nausea and pain but improved.  Currently awaiting right upper quadrant ultrasound.  Ongoing care assigned to my oncoming partner Dr. Dolores Frame with plan to follow-up on patient reassessment, right upper  quadrant ultrasound for concern of possible hepatobiliary process, and if this is negative I would proceed with CT scan abdomen pelvis to further evaluate for etiology of right upper abdominal pain nausea vomiting.  Ongoing care signed to Dr. Dolores Frame. ____________________________________________   FINAL CLINICAL IMPRESSION(S) / ED DIAGNOSES  Final diagnoses:  Abdominal pain        Note:  This document was prepared using Dragon voice recognition software and may include unintentional dictation errors       Sharyn Creamer, MD 03/10/21 0011

## 2021-03-10 ENCOUNTER — Other Ambulatory Visit: Payer: Self-pay

## 2021-03-10 ENCOUNTER — Emergency Department
Admission: EM | Admit: 2021-03-10 | Discharge: 2021-03-10 | Disposition: A | Discharge status: Home / Self Care | Payer: Medicare HMO | Source: Home / Self Care | Attending: Emergency Medicine | Admitting: Emergency Medicine

## 2021-03-10 ENCOUNTER — Emergency Department: Payer: Medicare HMO

## 2021-03-10 DIAGNOSIS — R109 Unspecified abdominal pain: Secondary | ICD-10-CM | POA: Diagnosis not present

## 2021-03-10 DIAGNOSIS — R1110 Vomiting, unspecified: Secondary | ICD-10-CM | POA: Insufficient documentation

## 2021-03-10 DIAGNOSIS — R1013 Epigastric pain: Secondary | ICD-10-CM | POA: Insufficient documentation

## 2021-03-10 DIAGNOSIS — R1010 Upper abdominal pain, unspecified: Secondary | ICD-10-CM | POA: Diagnosis not present

## 2021-03-10 DIAGNOSIS — Z5321 Procedure and treatment not carried out due to patient leaving prior to being seen by health care provider: Secondary | ICD-10-CM | POA: Insufficient documentation

## 2021-03-10 LAB — URINALYSIS, COMPLETE (UACMP) WITH MICROSCOPIC
Bacteria, UA: NONE SEEN
Bilirubin Urine: NEGATIVE
Glucose, UA: NEGATIVE mg/dL
Hgb urine dipstick: NEGATIVE
Ketones, ur: 5 mg/dL — AB
Leukocytes,Ua: NEGATIVE
Nitrite: NEGATIVE
Protein, ur: NEGATIVE mg/dL
Specific Gravity, Urine: 1.004 — ABNORMAL LOW (ref 1.005–1.030)
pH: 6 (ref 5.0–8.0)

## 2021-03-10 LAB — RESP PANEL BY RT-PCR (FLU A&B, COVID) ARPGX2
Influenza A by PCR: NEGATIVE
Influenza B by PCR: NEGATIVE
SARS Coronavirus 2 by RT PCR: NEGATIVE

## 2021-03-10 MED ORDER — SUCRALFATE 1 G PO TABS: 1.0000 g | ORAL_TABLET | Freq: Three times a day (TID) | ORAL | 0 refills | Status: DC

## 2021-03-10 MED ORDER — HYDROMORPHONE HCL 1 MG/ML IJ SOLN
1.0000 mg | Freq: Once | INTRAMUSCULAR | Status: AC
  Administered 2021-03-10: 1 mg via INTRAVENOUS
  Filled 2021-03-10: qty 1

## 2021-03-10 MED ORDER — FAMOTIDINE IN NACL 20-0.9 MG/50ML-% IV SOLN
20.0000 mg | Freq: Once | INTRAVENOUS | Status: AC
  Administered 2021-03-10: 20 mg via INTRAVENOUS
  Filled 2021-03-10: qty 50

## 2021-03-10 MED ORDER — IOHEXOL 300 MG/ML  SOLN
100.0000 mL | Freq: Once | INTRAMUSCULAR | Status: AC | PRN
  Administered 2021-03-10: 100 mL via INTRAVENOUS

## 2021-03-10 MED ORDER — METOCLOPRAMIDE HCL 5 MG/ML IJ SOLN
5.0000 mg | Freq: Once | INTRAMUSCULAR | Status: AC
  Administered 2021-03-10: 5 mg via INTRAVENOUS
  Filled 2021-03-10: qty 2

## 2021-03-10 MED ORDER — METOCLOPRAMIDE HCL 10 MG PO TABS: 10.0000 mg | ORAL_TABLET | Freq: Three times a day (TID) | ORAL | 0 refills | Status: DC | PRN

## 2021-03-10 MED ORDER — IOHEXOL 240 MG/ML SOLN
30.0000 mL | Freq: Once | INTRAMUSCULAR | Status: AC | PRN
  Administered 2021-03-10: 30 mL via ORAL

## 2021-03-10 NOTE — ED Triage Notes (Addendum)
Pt to ER via POV.   Pt reports right sided flank pain that radiates into her epigastric area since friday. States last night when she was seen here for the same she also had multiple episodes of emesis, reports no emesis since d.c. Was unable to stay for admission d/t issues with her dog. Pt was advised to return for admission by her PCP due to continued pain.

## 2021-03-10 NOTE — Discharge Instructions (Addendum)
You may take Reglan for nausea/vomiting.  Take Sucralfate 3 times daily with food and at bedtime.  Clear liquids x12 hours, then slowly advance diet as tolerated.  Return to the ER for worsening symptoms, persistent vomiting, difficulty breathing or other concerns

## 2021-03-10 NOTE — ED Provider Notes (Signed)
-----------------------------------------   12:14 AM on 03/10/2021 -----------------------------------------  Ultrasound interpreted per Dr. Marisue Humble:  Unremarkable right upper quadrant ultrasound. Normal sonographic  appearance of the gallbladder and biliary tree. No gallstones.   Updated patient on negative ultrasound results.  She still appears uncomfortable with pain in her midline epigastrium with burning sensation.  Will obtain CT abdomen/pelvis.  Will administer IV Dilaudid for pain, Reglan for nausea, Pepcid for burning sensation.  Will reassess.   ----------------------------------------- 2:44 AM on 03/10/2021 -----------------------------------------  CT abdomen pelvis interpreted per Dr. Rito Ehrlich:  Negative CT abdomen/pelvis.  Patient feeling better.  States Reglan did well for her nausea.  Drank 2 bottles oral contrast without emesis.  I did offer patient hospitalization but she needs to go home to take care of her dog.  Will discharge home with prescriptions for Reglan and sulcal fate.  She will follow-up closely with her PCP.  Strict return precautions given.  Patient verbalizes understanding agrees with plan of care.   Irean Hong, MD 03/10/21 [phone]

## 2021-03-10 NOTE — ED Notes (Signed)
Pt discussed with MD paduchowski. No need for labs at this time.

## 2021-03-10 NOTE — ED Notes (Signed)
Pt called in the [facility] and outside the main entrance , pt did not answer and is not visualized, attempt to call the pt via phone with no answer

## 2021-03-29 ENCOUNTER — Emergency Department: Payer: Medicare HMO

## 2021-03-29 ENCOUNTER — Encounter: Payer: Self-pay | Admitting: *Deleted

## 2021-03-29 ENCOUNTER — Emergency Department
Admission: EM | Admit: 2021-03-29 | Discharge: 2021-03-29 | Disposition: A | Discharge status: Home / Self Care | Payer: Medicare HMO | Attending: Emergency Medicine | Admitting: Emergency Medicine

## 2021-03-29 ENCOUNTER — Other Ambulatory Visit: Payer: Self-pay

## 2021-03-29 DIAGNOSIS — R112 Nausea with vomiting, unspecified: Secondary | ICD-10-CM | POA: Insufficient documentation

## 2021-03-29 DIAGNOSIS — N189 Chronic kidney disease, unspecified: Secondary | ICD-10-CM | POA: Insufficient documentation

## 2021-03-29 DIAGNOSIS — R110 Nausea: Secondary | ICD-10-CM | POA: Diagnosis not present

## 2021-03-29 DIAGNOSIS — R0789 Other chest pain: Secondary | ICD-10-CM | POA: Diagnosis not present

## 2021-03-29 DIAGNOSIS — Z79899 Other long term (current) drug therapy: Secondary | ICD-10-CM | POA: Diagnosis not present

## 2021-03-29 DIAGNOSIS — Z87442 Personal history of urinary calculi: Secondary | ICD-10-CM | POA: Diagnosis not present

## 2021-03-29 DIAGNOSIS — F17210 Nicotine dependence, cigarettes, uncomplicated: Secondary | ICD-10-CM | POA: Diagnosis not present

## 2021-03-29 DIAGNOSIS — R197 Diarrhea, unspecified: Secondary | ICD-10-CM | POA: Insufficient documentation

## 2021-03-29 DIAGNOSIS — R079 Chest pain, unspecified: Secondary | ICD-10-CM | POA: Insufficient documentation

## 2021-03-29 DIAGNOSIS — R1032 Left lower quadrant pain: Secondary | ICD-10-CM | POA: Diagnosis not present

## 2021-03-29 DIAGNOSIS — K219 Gastro-esophageal reflux disease without esophagitis: Secondary | ICD-10-CM | POA: Insufficient documentation

## 2021-03-29 DIAGNOSIS — R001 Bradycardia, unspecified: Secondary | ICD-10-CM | POA: Diagnosis not present

## 2021-03-29 DIAGNOSIS — J029 Acute pharyngitis, unspecified: Secondary | ICD-10-CM | POA: Insufficient documentation

## 2021-03-29 DIAGNOSIS — R1110 Vomiting, unspecified: Secondary | ICD-10-CM | POA: Diagnosis not present

## 2021-03-29 DIAGNOSIS — R109 Unspecified abdominal pain: Secondary | ICD-10-CM | POA: Diagnosis not present

## 2021-03-29 DIAGNOSIS — R1111 Vomiting without nausea: Secondary | ICD-10-CM | POA: Diagnosis not present

## 2021-03-29 LAB — CBC WITH DIFFERENTIAL/PLATELET
Abs Immature Granulocytes: 0.03 10*3/uL (ref 0.00–0.07)
Basophils Absolute: 0.1 10*3/uL (ref 0.0–0.1)
Basophils Relative: 1 %
Eosinophils Absolute: 0.2 10*3/uL (ref 0.0–0.5)
Eosinophils Relative: 3 %
HCT: 38.1 % (ref 36.0–46.0)
Hemoglobin: 12.8 g/dL (ref 12.0–15.0)
Immature Granulocytes: 0 %
Lymphocytes Relative: 27 %
Lymphs Abs: 1.9 10*3/uL (ref 0.7–4.0)
MCH: 33.6 pg (ref 26.0–34.0)
MCHC: 33.6 g/dL (ref 30.0–36.0)
MCV: 100 fL (ref 80.0–100.0)
Monocytes Absolute: 0.5 10*3/uL (ref 0.1–1.0)
Monocytes Relative: 7 %
Neutro Abs: 4.4 10*3/uL (ref 1.7–7.7)
Neutrophils Relative %: 62 %
Platelets: 293 10*3/uL (ref 150–400)
RBC: 3.81 MIL/uL — ABNORMAL LOW (ref 3.87–5.11)
RDW: 12.2 % (ref 11.5–15.5)
WBC: 7.1 10*3/uL (ref 4.0–10.5)
nRBC: 0 % (ref 0.0–0.2)

## 2021-03-29 LAB — URINALYSIS, COMPLETE (UACMP) WITH MICROSCOPIC
Bilirubin Urine: NEGATIVE
Glucose, UA: NEGATIVE mg/dL
Hgb urine dipstick: NEGATIVE
Ketones, ur: 5 mg/dL — AB
Nitrite: NEGATIVE
Protein, ur: NEGATIVE mg/dL
Specific Gravity, Urine: 1.023 (ref 1.005–1.030)
pH: 5 (ref 5.0–8.0)

## 2021-03-29 LAB — LIPASE, BLOOD: Lipase: 86 U/L — ABNORMAL HIGH (ref 11–51)

## 2021-03-29 LAB — COMPREHENSIVE METABOLIC PANEL
ALT: 73 U/L — ABNORMAL HIGH (ref 0–44)
AST: 42 U/L — ABNORMAL HIGH (ref 15–41)
Albumin: 3.9 g/dL (ref 3.5–5.0)
Alkaline Phosphatase: 59 U/L (ref 38–126)
Anion gap: 8 (ref 5–15)
BUN: 11 mg/dL (ref 6–20)
CO2: 22 mmol/L (ref 22–32)
Calcium: 8.4 mg/dL — ABNORMAL LOW (ref 8.9–10.3)
Chloride: 109 mmol/L (ref 98–111)
Creatinine, Ser: 0.82 mg/dL (ref 0.44–1.00)
GFR, Estimated: >60 mL/min (ref >60)
Glucose, Bld: 105 mg/dL — ABNORMAL HIGH (ref 70–99)
Potassium: 3.7 mmol/L (ref 3.5–5.1)
Sodium: 139 mmol/L (ref 135–145)
Total Bilirubin: 0.8 mg/dL (ref 0.3–1.2)
Total Protein: 6.9 g/dL (ref 6.5–8.1)

## 2021-03-29 MED ORDER — AZITHROMYCIN 500 MG PO TABS
500.0000 mg | ORAL_TABLET | Freq: Once | ORAL | Status: AC
  Administered 2021-03-29: 500 mg via ORAL
  Filled 2021-03-29: qty 1

## 2021-03-29 MED ORDER — MORPHINE SULFATE (PF) 4 MG/ML IV SOLN
4.0000 mg | Freq: Once | INTRAVENOUS | Status: AC
Start: 2021-03-29 — End: 2021-03-29
  Administered 2021-03-29: 4 mg via INTRAVENOUS
  Filled 2021-03-29: qty 1

## 2021-03-29 MED ORDER — SODIUM CHLORIDE 0.9 % IV BOLUS
1000.0000 mL | Freq: Once | INTRAVENOUS | Status: AC
  Administered 2021-03-29: 1000 mL via INTRAVENOUS

## 2021-03-29 MED ORDER — ONDANSETRON 4 MG PO TBDP: 4.0000 mg | ORAL_TABLET | Freq: Three times a day (TID) | ORAL | 0 refills | Status: DC | PRN

## 2021-03-29 MED ORDER — CIPROFLOXACIN HCL 500 MG PO TABS: 500.0000 mg | ORAL_TABLET | Freq: Once | ORAL | Status: DC

## 2021-03-29 MED ORDER — ONDANSETRON 4 MG PO TBDP
4.0000 mg | ORAL_TABLET | Freq: Once | ORAL | Status: AC
  Administered 2021-03-29: 4 mg via ORAL
  Filled 2021-03-29: qty 1

## 2021-03-29 MED ORDER — AZITHROMYCIN 250 MG PO TABS: 250.0000 mg | ORAL_TABLET | Freq: Every day | ORAL | 0 refills | Status: DC

## 2021-03-29 MED ORDER — IOHEXOL 350 MG/ML SOLN
75.0000 mL | Freq: Once | INTRAVENOUS | Status: AC | PRN
  Administered 2021-03-29: 75 mL via INTRAVENOUS
  Filled 2021-03-29: qty 75

## 2021-03-29 MED ORDER — ONDANSETRON HCL 4 MG/2ML IJ SOLN
4.0000 mg | Freq: Once | INTRAMUSCULAR | Status: AC
  Administered 2021-03-29: 4 mg via INTRAVENOUS
  Filled 2021-03-29: qty 2
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
## 2021-03-29 NOTE — ED Triage Notes (Signed)
Pt was seen at the beginning of July for vomiting. Unable to keep f/u appt w/ Dr. Welton Flakes. Pt ran out of reglan. Pt states she started vomiting x 2 days ago. Pt c/o diarrhea as well as vomiting. Pt c/o chest pain prior to the vomiting episode this time. Pt c/o sore throat related to vomiting.

## 2021-03-29 NOTE — ED Provider Notes (Signed)
[facility] Emergency Department Provider Note  Time seen: 10:45 AM  I have reviewed the triage vital signs and the nursing notes.   HISTORY  Chief Complaint Abdominal Pain (Pt c/o upper abdominal pain, n/v/d x 2 days. Pt c/o chest pain and sore throat related to her vomiting. )   HPI Candace Jordan is a 50 y.o. female with a past medical history of gastric reflux, kidney stones, presents emergency department for abdominal pain.  According to the patient over the past 2 days she has had nausea vomiting diarrhea and diffuse abdominal pain.  Patient states abdominal pain diffusely across the abdomen more so on the left lower quadrant.  Patient was recently seen for similar event and had a negative CT scan as well as negative ultrasound.  Patient denies any fever.  No chest pain.   Past Medical History:  Diagnosis Date   Chronic kidney disease    KIDNEY STONES   GERD (gastroesophageal reflux disease)    Seizures (HCC)    LAST SEIZURE IN 2015   Sleep apnea with use of continuous positive airway pressure (CPAP)     There are no problems to display for this patient.   Past Surgical History:  Procedure Laterality Date   ABDOMINAL HYSTERECTOMY     KNEE ARTHROSCOPY Right 07/24/2015   Procedure: ARTHROSCOPY KNEE partial medial and lateral menisectomy and lateral release;  Surgeon: Kennedy Bucker, MD;  Location: [facility] ORS;  Service: Orthopedics;  Laterality: Right;   KNEE SURGERY Right    KNEE SURGERY Right    WISDOM TOOTH EXTRACTION      Prior to Admission medications   Medication Sig Start Date End Date Taking? Authorizing Provider  cetirizine (ZYRTEC) 10 MG tablet Take 10 mg by mouth daily.    [provider]  gabapentin (NEURONTIN) 300 MG capsule Take 1 capsule (300 mg total) by mouth 3 (three) times daily. Patient taking differently: Take 300 mg by mouth 3 (three) times daily. Take 2 capsule (600 mg) in the morning, 1 capsule (300 mg) at noon, 3  capsules (900 mg) at night 03/08/15   Darci Current, MD  levETIRAcetam (KEPPRA) 250 MG tablet 500 mg every morning and 1000mg  every night 03/08/15   05/09/15, MD  metoCLOPramide (REGLAN) 10 MG tablet Take 1 tablet (10 mg total) by mouth every 8 (eight) hours as needed for nausea. 03/10/21   05/11/21, MD  omeprazole (PRILOSEC) 20 MG capsule Take 20 mg by mouth every morning.    [provider]  Oxycodone HCl 10 MG TABS Take 10 mg by mouth every 6 (six) hours as needed. 08/25/18   [provider]  sucralfate (CARAFATE) 1 g tablet Take 1 tablet (1 g total) by mouth 4 (four) times daily -  with meals and at bedtime. 03/10/21   05/11/21, MD    Allergies  Allergen Reactions   Topiramate Diarrhea   Lorazepam Hives   Meloxicam Other (See Comments)    Severe stomach pain Severe stomach pain Severe stomach pain    Sulfa Antibiotics Nausea And Vomiting   Klonopin [Clonazepam] Rash   Penicillins Rash    History reviewed. No pertinent family history.  Social History Social History   Tobacco Use   Smoking status: Every Day    Packs/day: 1.00    Years: 28.00    Pack years: 28.00    Types: Cigarettes   Smokeless tobacco: Never  Substance Use Topics   Alcohol use:  No   Drug use: Never    Review of Systems Constitutional: Negative for fever Cardiovascular: Negative for chest pain. Respiratory: Negative for shortness of breath. Gastrointestinal: Diffuse abdominal pain worse in left lower quadrant.  Moderate in severity aching dull pain.  Positive for nausea vomiting and diarrhea. Genitourinary: Negative for urinary compaints Musculoskeletal: Negative for musculoskeletal complaints Neurological: Negative for headache All other ROS negative  ____________________________________________   PHYSICAL EXAM:  VITAL SIGNS: ED Triage Vitals  Enc Vitals Group     BP 03/29/21 0603 (!) 120/96     Pulse Rate 03/29/21 0603 85     Resp 03/29/21 0603 18     Temp  03/29/21 0603 98.3 F (36.8 C)     Temp Source 03/29/21 0603 Oral     SpO2 03/29/21 0603 99 %     Weight 03/29/21 0606 231 lb 7.7 oz (105 kg)     Height 03/29/21 0606 5\' 7"  (1.702 m)     Head Circumference --      Peak Flow --      Pain Score 03/29/21 0604 9     Pain Loc --      Pain Edu? --      Excl. in GC? --    Constitutional: Alert and oriented. Well appearing and in no distress. Eyes: Normal exam ENT      Head: Normocephalic and atraumatic.      Mouth/Throat: Mucous membranes are moist. Cardiovascular: Normal rate, regular rhythm Respiratory: Normal respiratory effort without tachypnea nor retractions. Breath sounds are clear  Gastrointestinal: Soft, mild diffuse abdominal tenderness palpation with moderate tenderness to the left lower quadrant.  No rebound guarding or distention. Musculoskeletal: Nontender with normal range of motion in all extremities.  Neurologic:  Normal speech and language. No gross focal neurologic deficits  Skin:  Skin is warm, dry and intact.  Psychiatric: Mood and affect are normal.   ____________________________________________    EKG  EKG viewed and interpreted by myself shows normal sinus rhythm 85 bpm with a slightly widened QRS, normal axis, normal intervals with nonspecific ST changes but no ST elevation.  ____________________________________________    RADIOLOGY  CT shows signs of enteritis and possibly colitis.  ____________________________________________   INITIAL IMPRESSION / ASSESSMENT AND PLAN / ED COURSE  Pertinent labs & imaging results that were available during my care of the patient were reviewed by me and considered in my medical decision making (see chart for details).   Patient presents to the emergency department for 2 days of diffuse abdominal discomfort nausea vomiting diarrhea.  Patient states the nausea and vomiting have improved since arriving to the emergency department but continues to have abdominal  discomfort and diarrhea.  Patient was seen for similar event approximately 2 to 3 weeks ago at that time had a reassuring work-up.  Patient's lab work today does show mild LFT and lipase elevation.  Patient denies any alcohol use.  However patient is mostly tender in the left lower quadrant and has minimal to no tenderness in the right upper quadrant.  We will obtain CT imaging the abdomen once again given the patient's complaints.  We will treat pain nausea and IV hydrate with awaiting CT results.  Patient agreeable to plan of care.  CT shows likely enteritis/colitis.  Patient is feeling better after medicines and fluids.  Given the patient's complaint of symptoms we will cover with antibiotics as a precaution.  We will prescribe a nausea medication as well.  Patient agreeable to plan.  Candace Jordan was evaluated in Emergency Department on 03/29/2021 for the symptoms described in the history of present illness. She was evaluated in the context of the global COVID-19 pandemic, which necessitated consideration that the patient might be at risk for infection with the SARS-CoV-2 virus that causes COVID-19. Institutional protocols and algorithms that pertain to the evaluation of patients at risk for COVID-19 are in a state of rapid change based on information released by regulatory bodies including the CDC and federal and state organizations. These policies and algorithms were followed during the patient's care in the ED.  ____________________________________________   FINAL CLINICAL IMPRESSION(S) / ED DIAGNOSES  Abdominal pain Nausea vomiting diarrhea   Minna Antis, MD 03/29/21 1307

## 2021-03-30 DIAGNOSIS — M25552 Pain in left hip: Secondary | ICD-10-CM | POA: Diagnosis not present

## 2021-03-30 DIAGNOSIS — M25571 Pain in right ankle and joints of right foot: Secondary | ICD-10-CM | POA: Diagnosis not present

## 2021-03-30 DIAGNOSIS — M25551 Pain in right hip: Secondary | ICD-10-CM | POA: Diagnosis not present

## 2021-03-30 DIAGNOSIS — M25561 Pain in right knee: Secondary | ICD-10-CM | POA: Diagnosis not present

## 2021-03-30 DIAGNOSIS — R300 Dysuria: Secondary | ICD-10-CM | POA: Diagnosis not present

## 2021-03-30 DIAGNOSIS — M25572 Pain in left ankle and joints of left foot: Secondary | ICD-10-CM | POA: Diagnosis not present

## 2021-03-30 DIAGNOSIS — M25562 Pain in left knee: Secondary | ICD-10-CM | POA: Diagnosis not present

## 2021-03-30 DIAGNOSIS — M25569 Pain in unspecified knee: Secondary | ICD-10-CM | POA: Diagnosis not present

## 2021-03-30 DIAGNOSIS — Z79899 Other long term (current) drug therapy: Secondary | ICD-10-CM | POA: Diagnosis not present

## 2021-03-31 DIAGNOSIS — M25551 Pain in right hip: Secondary | ICD-10-CM | POA: Diagnosis not present

## 2021-03-31 DIAGNOSIS — Z79899 Other long term (current) drug therapy: Secondary | ICD-10-CM | POA: Diagnosis not present

## 2021-03-31 DIAGNOSIS — M25571 Pain in right ankle and joints of right foot: Secondary | ICD-10-CM | POA: Diagnosis not present

## 2021-03-31 DIAGNOSIS — M25569 Pain in unspecified knee: Secondary | ICD-10-CM | POA: Diagnosis not present

## 2021-03-31 DIAGNOSIS — M25561 Pain in right knee: Secondary | ICD-10-CM | POA: Diagnosis not present

## 2021-03-31 DIAGNOSIS — M25562 Pain in left knee: Secondary | ICD-10-CM | POA: Diagnosis not present

## 2021-03-31 DIAGNOSIS — M25572 Pain in left ankle and joints of left foot: Secondary | ICD-10-CM | POA: Diagnosis not present

## 2021-03-31 DIAGNOSIS — M25552 Pain in left hip: Secondary | ICD-10-CM | POA: Diagnosis not present

## 2021-03-31 DIAGNOSIS — R300 Dysuria: Secondary | ICD-10-CM | POA: Diagnosis not present

## 2021-04-01 DIAGNOSIS — G629 Polyneuropathy, unspecified: Secondary | ICD-10-CM | POA: Diagnosis not present

## 2021-04-01 DIAGNOSIS — E782 Mixed hyperlipidemia: Secondary | ICD-10-CM | POA: Diagnosis not present

## 2021-04-01 DIAGNOSIS — J301 Allergic rhinitis due to pollen: Secondary | ICD-10-CM | POA: Diagnosis not present

## 2021-04-01 DIAGNOSIS — E559 Vitamin D deficiency, unspecified: Secondary | ICD-10-CM | POA: Diagnosis not present

## 2021-04-01 DIAGNOSIS — I10 Essential (primary) hypertension: Secondary | ICD-10-CM | POA: Diagnosis not present

## 2021-04-01 DIAGNOSIS — F411 Generalized anxiety disorder: Secondary | ICD-10-CM | POA: Diagnosis not present

## 2021-04-01 DIAGNOSIS — E785 Hyperlipidemia, unspecified: Secondary | ICD-10-CM | POA: Diagnosis not present

## 2021-04-03 ENCOUNTER — Emergency Department
Admission: EM | Admit: 2021-04-03 | Discharge: 2021-04-03 | Disposition: A | Discharge status: Home / Self Care | Payer: Medicare HMO

## 2021-04-03 DIAGNOSIS — I10 Essential (primary) hypertension: Secondary | ICD-10-CM | POA: Diagnosis not present

## 2021-04-03 DIAGNOSIS — R52 Pain, unspecified: Secondary | ICD-10-CM | POA: Diagnosis not present
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
## 2021-04-03 NOTE — ED Notes (Signed)
Called pt name x3 for triage. No answer.

## 2021-04-24 DIAGNOSIS — G40219 Localization-related (focal) (partial) symptomatic epilepsy and epileptic syndromes with complex partial seizures, intractable, without status epilepticus: Secondary | ICD-10-CM | POA: Diagnosis not present

## 2021-04-24 DIAGNOSIS — I10 Essential (primary) hypertension: Secondary | ICD-10-CM | POA: Diagnosis not present

## 2021-04-24 DIAGNOSIS — F4310 Post-traumatic stress disorder, unspecified: Secondary | ICD-10-CM | POA: Diagnosis not present

## 2021-04-24 DIAGNOSIS — E782 Mixed hyperlipidemia: Secondary | ICD-10-CM | POA: Diagnosis not present

## 2021-04-24 DIAGNOSIS — F71 Moderate intellectual disabilities: Secondary | ICD-10-CM | POA: Diagnosis not present

## 2021-04-24 DIAGNOSIS — F411 Generalized anxiety disorder: Secondary | ICD-10-CM | POA: Diagnosis not present

## 2021-04-24 DIAGNOSIS — M4727 Other spondylosis with radiculopathy, lumbosacral region: Secondary | ICD-10-CM | POA: Diagnosis not present

## 2021-04-29 DIAGNOSIS — M5440 Lumbago with sciatica, unspecified side: Secondary | ICD-10-CM | POA: Diagnosis not present

## 2021-04-29 DIAGNOSIS — G894 Chronic pain syndrome: Secondary | ICD-10-CM | POA: Diagnosis not present

## 2021-04-29 DIAGNOSIS — M797 Fibromyalgia: Secondary | ICD-10-CM | POA: Diagnosis not present

## 2021-04-29 DIAGNOSIS — Z79899 Other long term (current) drug therapy: Secondary | ICD-10-CM | POA: Diagnosis not present

## 2021-04-29 DIAGNOSIS — R569 Unspecified convulsions: Secondary | ICD-10-CM | POA: Diagnosis not present

## 2021-04-29 DIAGNOSIS — M1990 Unspecified osteoarthritis, unspecified site: Secondary | ICD-10-CM | POA: Diagnosis not present

## 2021-04-29 DIAGNOSIS — M5416 Radiculopathy, lumbar region: Secondary | ICD-10-CM | POA: Diagnosis not present

## 2021-04-29 DIAGNOSIS — M791 Myalgia, unspecified site: Secondary | ICD-10-CM | POA: Diagnosis not present

## 2021-04-29 DIAGNOSIS — F1120 Opioid dependence, uncomplicated: Secondary | ICD-10-CM | POA: Diagnosis not present

## 2021-04-29 DIAGNOSIS — F809 Developmental disorder of speech and language, unspecified: Secondary | ICD-10-CM | POA: Diagnosis not present

## 2021-05-19 DIAGNOSIS — F4310 Post-traumatic stress disorder, unspecified: Secondary | ICD-10-CM | POA: Diagnosis not present

## 2021-05-19 DIAGNOSIS — F17200 Nicotine dependence, unspecified, uncomplicated: Secondary | ICD-10-CM | POA: Diagnosis not present

## 2021-05-19 DIAGNOSIS — F71 Moderate intellectual disabilities: Secondary | ICD-10-CM | POA: Diagnosis not present

## 2021-05-19 DIAGNOSIS — M25551 Pain in right hip: Secondary | ICD-10-CM | POA: Diagnosis not present

## 2021-05-19 DIAGNOSIS — M25559 Pain in unspecified hip: Secondary | ICD-10-CM | POA: Diagnosis not present

## 2021-05-19 DIAGNOSIS — Z23 Encounter for immunization: Secondary | ICD-10-CM | POA: Diagnosis not present

## 2021-05-19 DIAGNOSIS — F4312 Post-traumatic stress disorder, chronic: Secondary | ICD-10-CM | POA: Diagnosis not present

## 2021-05-19 DIAGNOSIS — M4727 Other spondylosis with radiculopathy, lumbosacral region: Secondary | ICD-10-CM | POA: Diagnosis not present

## 2021-05-19 DIAGNOSIS — J399 Disease of upper respiratory tract, unspecified: Secondary | ICD-10-CM | POA: Diagnosis not present

## 2021-05-25 DIAGNOSIS — G894 Chronic pain syndrome: Secondary | ICD-10-CM | POA: Diagnosis not present

## 2021-05-27 DIAGNOSIS — F1120 Opioid dependence, uncomplicated: Secondary | ICD-10-CM | POA: Diagnosis not present

## 2021-05-27 DIAGNOSIS — R569 Unspecified convulsions: Secondary | ICD-10-CM | POA: Diagnosis not present

## 2021-05-27 DIAGNOSIS — G894 Chronic pain syndrome: Secondary | ICD-10-CM | POA: Diagnosis not present

## 2021-05-27 DIAGNOSIS — M791 Myalgia, unspecified site: Secondary | ICD-10-CM | POA: Diagnosis not present

## 2021-05-27 DIAGNOSIS — M5416 Radiculopathy, lumbar region: Secondary | ICD-10-CM | POA: Diagnosis not present

## 2021-05-27 DIAGNOSIS — F809 Developmental disorder of speech and language, unspecified: Secondary | ICD-10-CM | POA: Diagnosis not present

## 2021-05-27 DIAGNOSIS — M797 Fibromyalgia: Secondary | ICD-10-CM | POA: Diagnosis not present

## 2021-05-27 DIAGNOSIS — M1990 Unspecified osteoarthritis, unspecified site: Secondary | ICD-10-CM | POA: Diagnosis not present

## 2021-05-27 DIAGNOSIS — M5440 Lumbago with sciatica, unspecified side: Secondary | ICD-10-CM | POA: Diagnosis not present

## 2021-06-04 DIAGNOSIS — G40219 Localization-related (focal) (partial) symptomatic epilepsy and epileptic syndromes with complex partial seizures, intractable, without status epilepticus: Secondary | ICD-10-CM | POA: Diagnosis not present

## 2021-07-10 DIAGNOSIS — T7631XA Adult psychological abuse, suspected, initial encounter: Secondary | ICD-10-CM | POA: Diagnosis not present

## 2021-07-10 DIAGNOSIS — F411 Generalized anxiety disorder: Secondary | ICD-10-CM | POA: Diagnosis not present

## 2021-07-10 DIAGNOSIS — F71 Moderate intellectual disabilities: Secondary | ICD-10-CM | POA: Diagnosis not present

## 2021-07-10 DIAGNOSIS — F32A Depression, unspecified: Secondary | ICD-10-CM | POA: Diagnosis not present

## 2021-07-13 DIAGNOSIS — F809 Developmental disorder of speech and language, unspecified: Secondary | ICD-10-CM | POA: Diagnosis not present

## 2021-07-13 DIAGNOSIS — G8929 Other chronic pain: Secondary | ICD-10-CM | POA: Diagnosis not present

## 2021-07-13 DIAGNOSIS — G894 Chronic pain syndrome: Secondary | ICD-10-CM | POA: Diagnosis not present

## 2021-07-13 DIAGNOSIS — M791 Myalgia, unspecified site: Secondary | ICD-10-CM | POA: Diagnosis not present

## 2021-07-13 DIAGNOSIS — M5416 Radiculopathy, lumbar region: Secondary | ICD-10-CM | POA: Diagnosis not present

## 2021-07-13 DIAGNOSIS — M797 Fibromyalgia: Secondary | ICD-10-CM | POA: Diagnosis not present

## 2021-07-13 DIAGNOSIS — F1120 Opioid dependence, uncomplicated: Secondary | ICD-10-CM | POA: Diagnosis not present

## 2021-07-13 DIAGNOSIS — M1990 Unspecified osteoarthritis, unspecified site: Secondary | ICD-10-CM | POA: Diagnosis not present

## 2021-07-13 DIAGNOSIS — M545 Low back pain, unspecified: Secondary | ICD-10-CM | POA: Diagnosis not present

## 2021-07-13 DIAGNOSIS — F17210 Nicotine dependence, cigarettes, uncomplicated: Secondary | ICD-10-CM | POA: Diagnosis not present

## 2021-07-15 ENCOUNTER — Emergency Department: Payer: Medicare HMO

## 2021-07-15 ENCOUNTER — Other Ambulatory Visit: Payer: Self-pay

## 2021-07-15 ENCOUNTER — Emergency Department
Admission: EM | Admit: 2021-07-15 | Discharge: 2021-07-15 | Disposition: A | Discharge status: Home / Self Care | Payer: Medicare HMO | Attending: Emergency Medicine | Admitting: Emergency Medicine

## 2021-07-15 DIAGNOSIS — I959 Hypotension, unspecified: Secondary | ICD-10-CM | POA: Diagnosis not present

## 2021-07-15 DIAGNOSIS — K8020 Calculus of gallbladder without cholecystitis without obstruction: Secondary | ICD-10-CM | POA: Diagnosis not present

## 2021-07-15 DIAGNOSIS — R079 Chest pain, unspecified: Secondary | ICD-10-CM | POA: Diagnosis not present

## 2021-07-15 DIAGNOSIS — R000 Tachycardia, unspecified: Secondary | ICD-10-CM | POA: Diagnosis not present

## 2021-07-15 DIAGNOSIS — K8050 Calculus of bile duct without cholangitis or cholecystitis without obstruction: Secondary | ICD-10-CM

## 2021-07-15 DIAGNOSIS — K8051 Calculus of bile duct without cholangitis or cholecystitis with obstruction: Secondary | ICD-10-CM | POA: Diagnosis not present

## 2021-07-15 DIAGNOSIS — F17210 Nicotine dependence, cigarettes, uncomplicated: Secondary | ICD-10-CM | POA: Diagnosis not present

## 2021-07-15 DIAGNOSIS — M79603 Pain in arm, unspecified: Secondary | ICD-10-CM | POA: Diagnosis not present

## 2021-07-15 DIAGNOSIS — R7989 Other specified abnormal findings of blood chemistry: Secondary | ICD-10-CM | POA: Diagnosis not present

## 2021-07-15 DIAGNOSIS — R1011 Right upper quadrant pain: Secondary | ICD-10-CM | POA: Diagnosis not present

## 2021-07-15 DIAGNOSIS — N189 Chronic kidney disease, unspecified: Secondary | ICD-10-CM | POA: Diagnosis not present

## 2021-07-15 DIAGNOSIS — R0789 Other chest pain: Secondary | ICD-10-CM

## 2021-07-15 LAB — CBC WITH DIFFERENTIAL/PLATELET
Abs Immature Granulocytes: 0.02 10*3/uL (ref 0.00–0.07)
Basophils Absolute: 0.1 10*3/uL (ref 0.0–0.1)
Basophils Relative: 1 %
Eosinophils Absolute: 0.4 10*3/uL (ref 0.0–0.5)
Eosinophils Relative: 4 %
HCT: 42.6 % (ref 36.0–46.0)
Hemoglobin: 13.9 g/dL (ref 12.0–15.0)
Immature Granulocytes: 0 %
Lymphocytes Relative: 35 %
Lymphs Abs: 3.4 10*3/uL (ref 0.7–4.0)
MCH: 32.5 pg (ref 26.0–34.0)
MCHC: 32.6 g/dL (ref 30.0–36.0)
MCV: 99.5 fL (ref 80.0–100.0)
Monocytes Absolute: 0.8 10*3/uL (ref 0.1–1.0)
Monocytes Relative: 8 %
Neutro Abs: 5.1 10*3/uL (ref 1.7–7.7)
Neutrophils Relative %: 52 %
Platelets: 284 10*3/uL (ref 150–400)
RBC: 4.28 MIL/uL (ref 3.87–5.11)
RDW: 12.9 % (ref 11.5–15.5)
WBC: 9.8 10*3/uL (ref 4.0–10.5)
nRBC: 0 % (ref 0.0–0.2)

## 2021-07-15 LAB — URINALYSIS, ROUTINE W REFLEX MICROSCOPIC
Bacteria, UA: NONE SEEN
Bilirubin Urine: NEGATIVE
Glucose, UA: NEGATIVE mg/dL
Hgb urine dipstick: NEGATIVE
Ketones, ur: 5 mg/dL — AB
Nitrite: NEGATIVE
Protein, ur: NEGATIVE mg/dL
Specific Gravity, Urine: 1.02 (ref 1.005–1.030)
pH: 5 (ref 5.0–8.0)

## 2021-07-15 LAB — POC URINE PREG, ED: Preg Test, Ur: NEGATIVE

## 2021-07-15 LAB — BASIC METABOLIC PANEL
Anion gap: 8 (ref 5–15)
BUN: 11 mg/dL (ref 6–20)
CO2: 26 mmol/L (ref 22–32)
Calcium: 9.3 mg/dL (ref 8.9–10.3)
Chloride: 104 mmol/L (ref 98–111)
Creatinine, Ser: 0.74 mg/dL (ref 0.44–1.00)
GFR, Estimated: >60 mL/min (ref >60)
Glucose, Bld: 116 mg/dL — ABNORMAL HIGH (ref 70–99)
Potassium: 4.4 mmol/L (ref 3.5–5.1)
Sodium: 138 mmol/L (ref 135–145)

## 2021-07-15 LAB — HEPATIC FUNCTION PANEL
ALT: 20 U/L (ref 0–44)
AST: 19 U/L (ref 15–41)
Albumin: 4.1 g/dL (ref 3.5–5.0)
Alkaline Phosphatase: 64 U/L (ref 38–126)
Bilirubin, Direct: 0.1 mg/dL (ref 0.0–0.2)
Indirect Bilirubin: 0.6 mg/dL (ref 0.3–0.9)
Total Bilirubin: 0.7 mg/dL (ref 0.3–1.2)
Total Protein: 7.2 g/dL (ref 6.5–8.1)

## 2021-07-15 LAB — TROPONIN I (HIGH SENSITIVITY)
Troponin I (High Sensitivity): <2 ng/L (ref <18)
Troponin I (High Sensitivity): 3 ng/L (ref <18)

## 2021-07-15 LAB — D-DIMER, QUANTITATIVE: D-Dimer, Quant: 0.9 ug/mL-FEU — ABNORMAL HIGH (ref 0.00–0.50)

## 2021-07-15 MED ORDER — METOCLOPRAMIDE HCL 5 MG/ML IJ SOLN
10.0000 mg | Freq: Once | INTRAMUSCULAR | Status: AC
  Administered 2021-07-15: 10 mg via INTRAVENOUS
  Filled 2021-07-15: qty 2

## 2021-07-15 MED ORDER — MORPHINE SULFATE (PF) 4 MG/ML IV SOLN
4.0000 mg | Freq: Once | INTRAVENOUS | Status: AC
Start: 2021-07-15 — End: 2021-07-15
  Administered 2021-07-15: 4 mg via INTRAVENOUS
  Filled 2021-07-15: qty 1

## 2021-07-15 MED ORDER — IOHEXOL 350 MG/ML SOLN
75.0000 mL | Freq: Once | INTRAVENOUS | Status: AC | PRN
  Administered 2021-07-15: 75 mL via INTRAVENOUS

## 2021-07-15 MED ORDER — HYDROCODONE-ACETAMINOPHEN 5-325 MG PO TABS: 1.0000 | ORAL_TABLET | Freq: Four times a day (QID) | ORAL | 0 refills | Status: AC | PRN

## 2021-07-15 MED ORDER — ONDANSETRON 8 MG PO TBDP: 8.0000 mg | ORAL_TABLET | Freq: Three times a day (TID) | ORAL | 0 refills | Status: AC | PRN

## 2021-07-15 MED ORDER — ONDANSETRON HCL 4 MG/2ML IJ SOLN
4.0000 mg | Freq: Once | INTRAMUSCULAR | Status: AC
  Administered 2021-07-15: 4 mg via INTRAVENOUS
  Filled 2021-07-15: qty 2

## 2021-07-15 NOTE — ED Triage Notes (Signed)
Pt presents with EMS for chest pain and hypotension. 88/60 manual with EMS.
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
## 2021-07-15 NOTE — Discharge Instructions (Addendum)
We suspect that your chest pain and right upper side pain is likely due to gallstones.  Take the pain and nausea medications as needed over the next few days to weeks if you have recurrent episodes of this pain.  Make an appointment to follow-up with a general surgeon as you likely will need to have her gallbladder taken out at some point.  Return to the ER immediately for new, worsening, or persistent severe pain, vomiting, fever, difficulty breathing, or any other new or worsening symptoms that concern you.

## 2021-07-28 DIAGNOSIS — Z888 Allergy status to other drugs, medicaments and biological substances status: Secondary | ICD-10-CM | POA: Diagnosis not present

## 2021-07-28 DIAGNOSIS — Z880 Allergy status to penicillin: Secondary | ICD-10-CM | POA: Diagnosis not present

## 2021-07-28 DIAGNOSIS — R1011 Right upper quadrant pain: Secondary | ICD-10-CM | POA: Diagnosis not present

## 2021-07-28 DIAGNOSIS — R079 Chest pain, unspecified: Secondary | ICD-10-CM | POA: Diagnosis not present

## 2021-07-28 DIAGNOSIS — K760 Fatty (change of) liver, not elsewhere classified: Secondary | ICD-10-CM | POA: Diagnosis not present

## 2021-07-28 DIAGNOSIS — Z20822 Contact with and (suspected) exposure to covid-19: Secondary | ICD-10-CM | POA: Diagnosis not present

## 2021-07-28 DIAGNOSIS — N3001 Acute cystitis with hematuria: Secondary | ICD-10-CM | POA: Diagnosis not present

## 2021-07-28 DIAGNOSIS — Z882 Allergy status to sulfonamides status: Secondary | ICD-10-CM | POA: Diagnosis not present

## 2021-07-29 DIAGNOSIS — R079 Chest pain, unspecified: Secondary | ICD-10-CM | POA: Diagnosis not present

## 2021-07-29 DIAGNOSIS — K760 Fatty (change of) liver, not elsewhere classified: Secondary | ICD-10-CM | POA: Diagnosis not present

## 2021-07-29 DIAGNOSIS — R1011 Right upper quadrant pain: Secondary | ICD-10-CM | POA: Diagnosis not present

## 2021-08-05 DIAGNOSIS — F4310 Post-traumatic stress disorder, unspecified: Secondary | ICD-10-CM | POA: Diagnosis not present

## 2021-08-06 DIAGNOSIS — R1011 Right upper quadrant pain: Secondary | ICD-10-CM | POA: Diagnosis not present

## 2021-08-06 DIAGNOSIS — Z8719 Personal history of other diseases of the digestive system: Secondary | ICD-10-CM | POA: Diagnosis not present

## 2021-08-06 DIAGNOSIS — R1013 Epigastric pain: Secondary | ICD-10-CM | POA: Diagnosis not present

## 2021-08-06 DIAGNOSIS — Z1211 Encounter for screening for malignant neoplasm of colon: Secondary | ICD-10-CM | POA: Diagnosis not present

## 2021-08-14 DIAGNOSIS — F4310 Post-traumatic stress disorder, unspecified: Secondary | ICD-10-CM | POA: Diagnosis not present

## 2021-08-15 DIAGNOSIS — K2900 Acute gastritis without bleeding: Secondary | ICD-10-CM | POA: Diagnosis not present

## 2021-08-15 DIAGNOSIS — F17210 Nicotine dependence, cigarettes, uncomplicated: Secondary | ICD-10-CM | POA: Diagnosis not present

## 2021-08-15 DIAGNOSIS — R112 Nausea with vomiting, unspecified: Secondary | ICD-10-CM | POA: Diagnosis not present

## 2021-08-15 DIAGNOSIS — Z888 Allergy status to other drugs, medicaments and biological substances status: Secondary | ICD-10-CM | POA: Diagnosis not present

## 2021-08-15 DIAGNOSIS — Z880 Allergy status to penicillin: Secondary | ICD-10-CM | POA: Diagnosis not present

## 2021-08-15 DIAGNOSIS — Z882 Allergy status to sulfonamides status: Secondary | ICD-10-CM | POA: Diagnosis not present

## 2021-08-15 DIAGNOSIS — R079 Chest pain, unspecified: Secondary | ICD-10-CM | POA: Diagnosis not present

## 2021-08-15 DIAGNOSIS — R1011 Right upper quadrant pain: Secondary | ICD-10-CM | POA: Diagnosis not present

## 2021-08-24 DIAGNOSIS — G894 Chronic pain syndrome: Secondary | ICD-10-CM | POA: Diagnosis not present

## 2021-09-01 DIAGNOSIS — F4310 Post-traumatic stress disorder, unspecified: Secondary | ICD-10-CM | POA: Diagnosis not present

## 2021-09-01 DIAGNOSIS — G40909 Epilepsy, unspecified, not intractable, without status epilepticus: Secondary | ICD-10-CM | POA: Diagnosis not present

## 2021-09-01 DIAGNOSIS — K259 Gastric ulcer, unspecified as acute or chronic, without hemorrhage or perforation: Secondary | ICD-10-CM | POA: Diagnosis not present

## 2021-09-01 DIAGNOSIS — G8929 Other chronic pain: Secondary | ICD-10-CM | POA: Diagnosis not present

## 2021-09-01 DIAGNOSIS — R109 Unspecified abdominal pain: Secondary | ICD-10-CM | POA: Diagnosis not present

## 2021-09-01 DIAGNOSIS — M549 Dorsalgia, unspecified: Secondary | ICD-10-CM | POA: Diagnosis not present

## 2021-09-01 DIAGNOSIS — K635 Polyp of colon: Secondary | ICD-10-CM | POA: Diagnosis not present

## 2021-09-01 DIAGNOSIS — K2970 Gastritis, unspecified, without bleeding: Secondary | ICD-10-CM | POA: Diagnosis not present

## 2021-09-01 DIAGNOSIS — K219 Gastro-esophageal reflux disease without esophagitis: Secondary | ICD-10-CM | POA: Diagnosis not present

## 2021-09-01 DIAGNOSIS — Z1211 Encounter for screening for malignant neoplasm of colon: Secondary | ICD-10-CM | POA: Diagnosis not present

## 2021-09-01 DIAGNOSIS — K633 Ulcer of intestine: Secondary | ICD-10-CM | POA: Diagnosis not present

## 2021-09-04 DIAGNOSIS — F4310 Post-traumatic stress disorder, unspecified: Secondary | ICD-10-CM | POA: Diagnosis not present

## 2021-09-09 DIAGNOSIS — R1011 Right upper quadrant pain: Secondary | ICD-10-CM | POA: Diagnosis not present

## 2021-09-09 DIAGNOSIS — Z8719 Personal history of other diseases of the digestive system: Secondary | ICD-10-CM | POA: Diagnosis not present

## 2021-09-09 DIAGNOSIS — R1013 Epigastric pain: Secondary | ICD-10-CM | POA: Diagnosis not present

## 2021-09-20 DIAGNOSIS — F17210 Nicotine dependence, cigarettes, uncomplicated: Secondary | ICD-10-CM | POA: Diagnosis not present

## 2021-09-20 DIAGNOSIS — R079 Chest pain, unspecified: Secondary | ICD-10-CM | POA: Diagnosis not present

## 2021-09-20 DIAGNOSIS — K529 Noninfective gastroenteritis and colitis, unspecified: Secondary | ICD-10-CM | POA: Diagnosis not present

## 2021-09-20 DIAGNOSIS — R1084 Generalized abdominal pain: Secondary | ICD-10-CM | POA: Diagnosis not present

## 2021-09-20 DIAGNOSIS — R0789 Other chest pain: Secondary | ICD-10-CM | POA: Diagnosis not present

## 2021-09-20 DIAGNOSIS — Z888 Allergy status to other drugs, medicaments and biological substances status: Secondary | ICD-10-CM | POA: Diagnosis not present

## 2021-09-20 DIAGNOSIS — Z882 Allergy status to sulfonamides status: Secondary | ICD-10-CM | POA: Diagnosis not present

## 2021-09-20 DIAGNOSIS — Z880 Allergy status to penicillin: Secondary | ICD-10-CM | POA: Diagnosis not present

## 2021-09-20 DIAGNOSIS — M549 Dorsalgia, unspecified: Secondary | ICD-10-CM | POA: Diagnosis not present

## 2021-09-20 DIAGNOSIS — Z886 Allergy status to analgesic agent status: Secondary | ICD-10-CM | POA: Diagnosis not present

## 2021-09-20 DIAGNOSIS — R1010 Upper abdominal pain, unspecified: Secondary | ICD-10-CM | POA: Diagnosis not present

## 2021-09-20 DIAGNOSIS — Z79891 Long term (current) use of opiate analgesic: Secondary | ICD-10-CM | POA: Diagnosis not present

## 2021-09-23 DIAGNOSIS — R569 Unspecified convulsions: Secondary | ICD-10-CM | POA: Diagnosis not present

## 2021-09-23 DIAGNOSIS — Z79891 Long term (current) use of opiate analgesic: Secondary | ICD-10-CM | POA: Diagnosis not present

## 2021-09-23 DIAGNOSIS — F809 Developmental disorder of speech and language, unspecified: Secondary | ICD-10-CM | POA: Diagnosis not present

## 2021-09-23 DIAGNOSIS — M1990 Unspecified osteoarthritis, unspecified site: Secondary | ICD-10-CM | POA: Diagnosis not present

## 2021-09-23 DIAGNOSIS — M791 Myalgia, unspecified site: Secondary | ICD-10-CM | POA: Diagnosis not present

## 2021-09-23 DIAGNOSIS — M5416 Radiculopathy, lumbar region: Secondary | ICD-10-CM | POA: Diagnosis not present

## 2021-09-23 DIAGNOSIS — M797 Fibromyalgia: Secondary | ICD-10-CM | POA: Diagnosis not present

## 2021-09-23 DIAGNOSIS — G894 Chronic pain syndrome: Secondary | ICD-10-CM | POA: Diagnosis not present

## 2021-09-23 DIAGNOSIS — M545 Low back pain, unspecified: Secondary | ICD-10-CM | POA: Diagnosis not present

## 2021-09-23 DIAGNOSIS — M5440 Lumbago with sciatica, unspecified side: Secondary | ICD-10-CM | POA: Diagnosis not present

## 2022-12-06 IMAGING — US US ABDOMEN LIMITED
1 series · 14 of 25 positions shown · non-contrast
Comparison: CT abdomen/pelvis dated 03/29/2021

CLINICAL DATA: Right upper quadrant pain x3 hours

EXAM:
ULTRASOUND ABDOMEN LIMITED RIGHT UPPER QUADRANT

[Series 1: us abdomen limited ruq (liver/gb) · 14 of 53 slices shown]
[im 1/53]
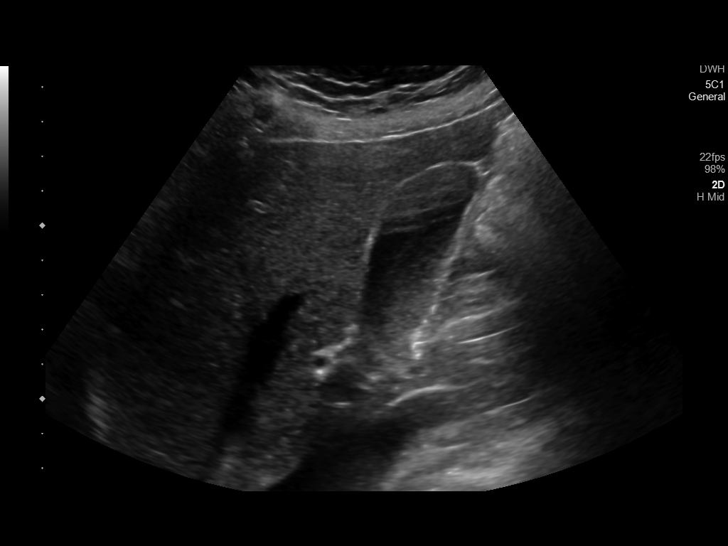
[im 5/53]
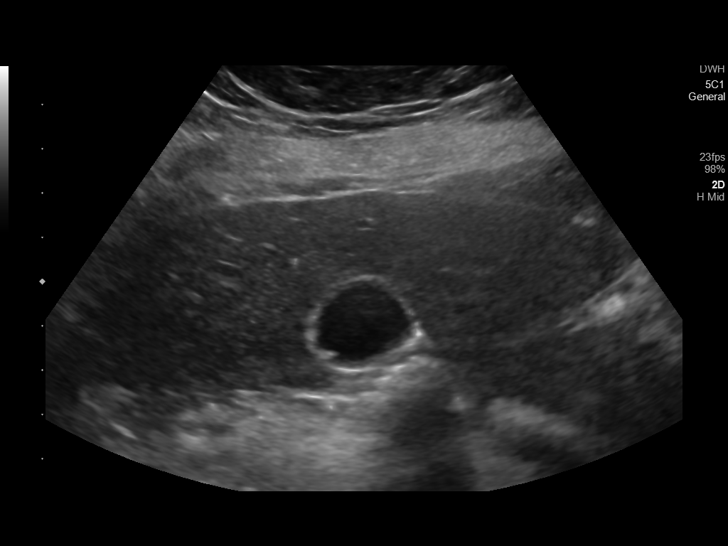
[im 9/53]
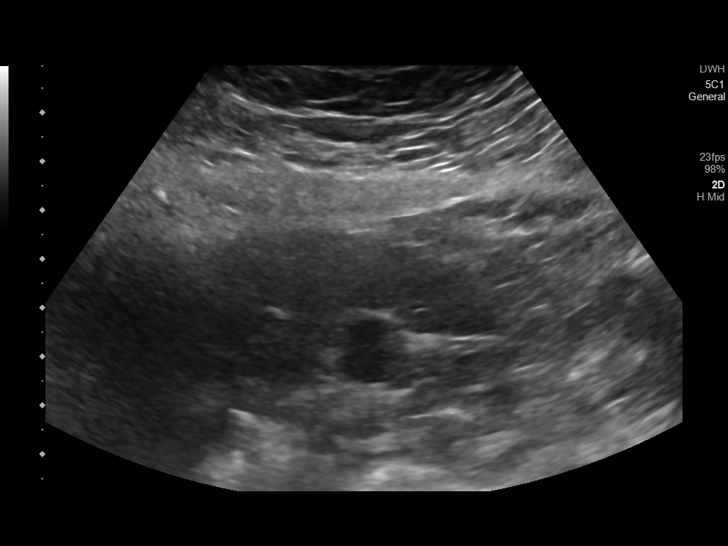
[im 14/53]
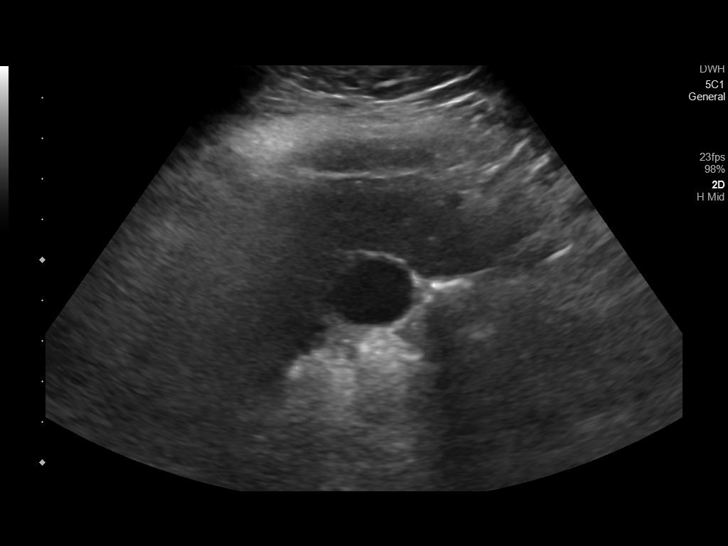
[im 18/53]
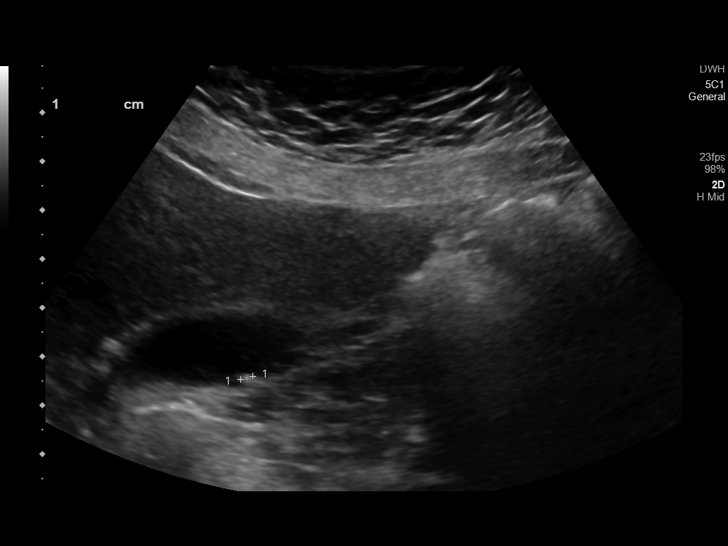
[im 20/53]
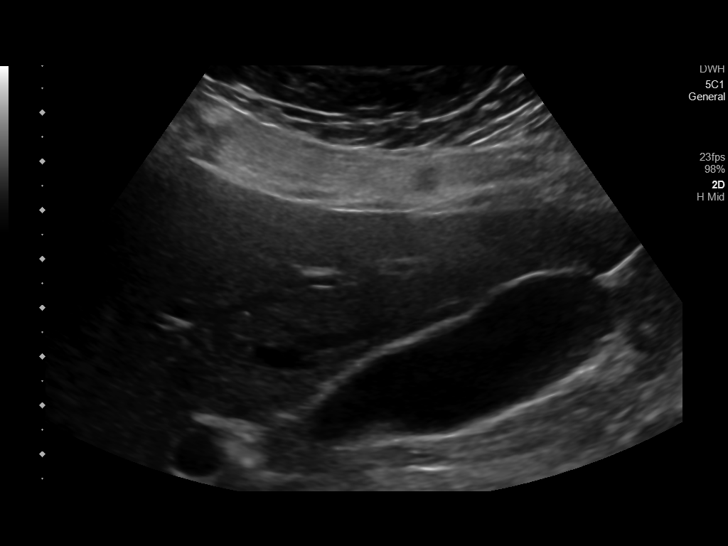
[im 24/53]
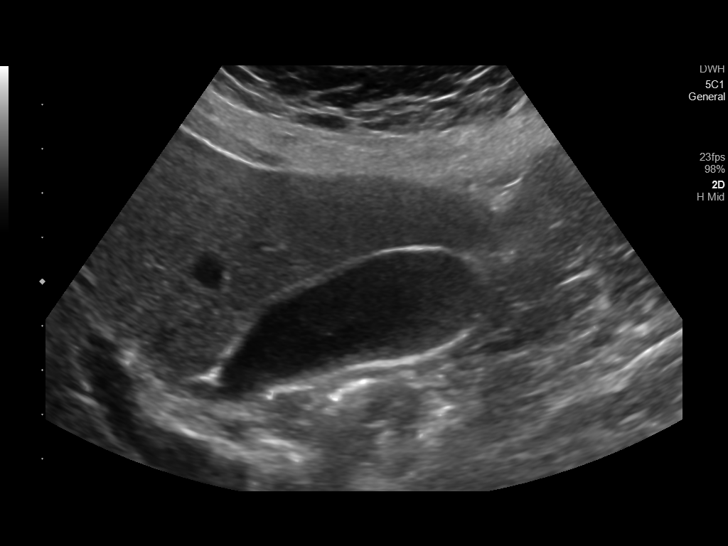
[im 29/53]
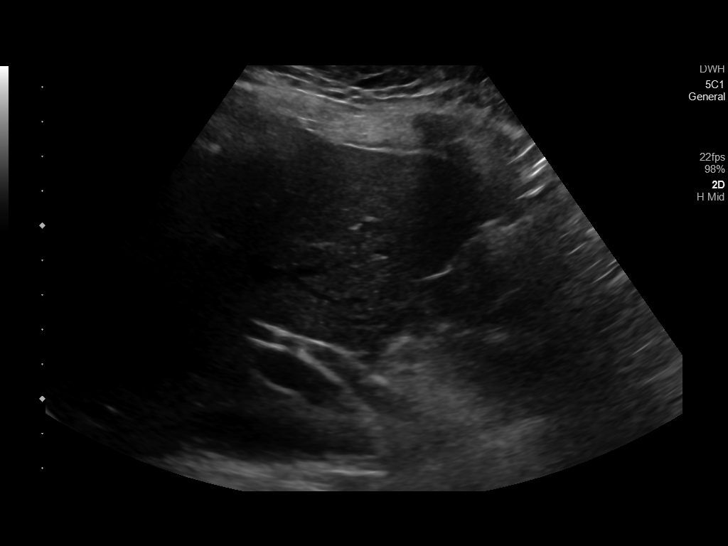
[im 33/53]
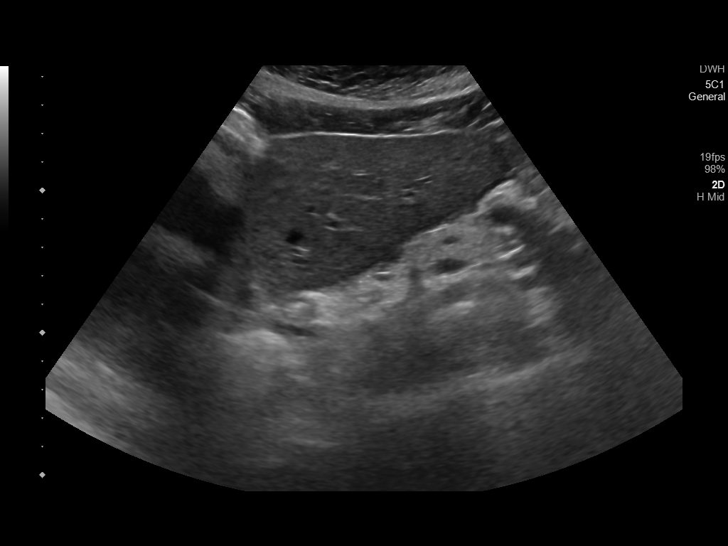
[im 35/53]
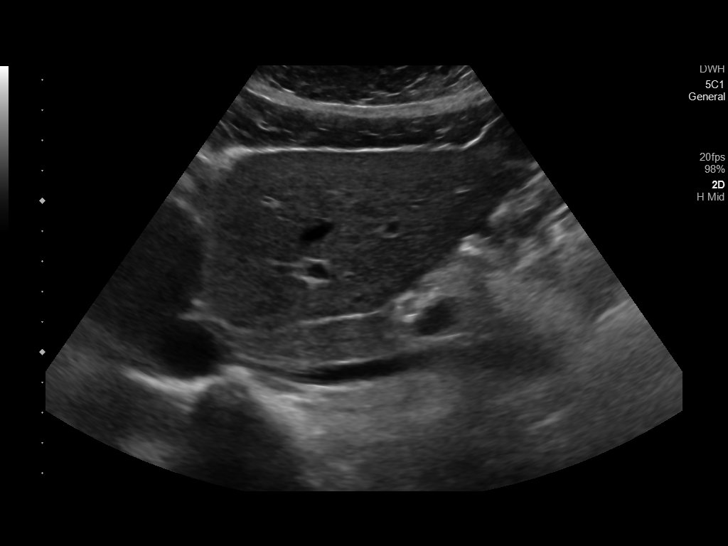
[im 40/53]
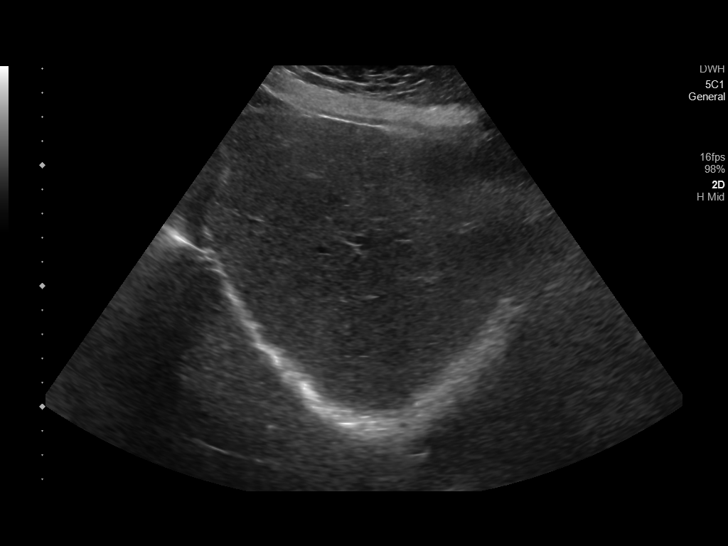
[im 44/53]
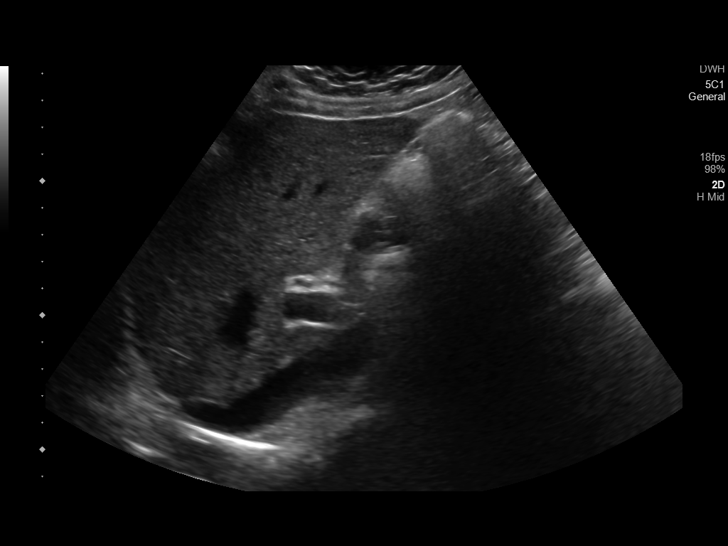
[im 48/53]
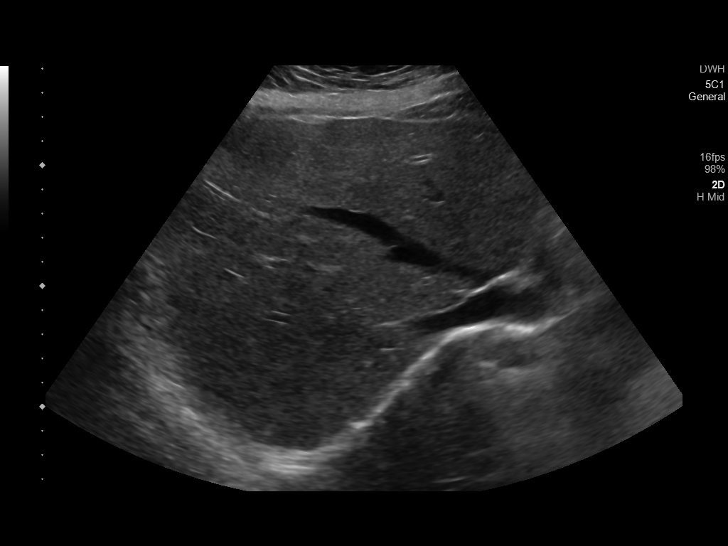
[im 53/53]
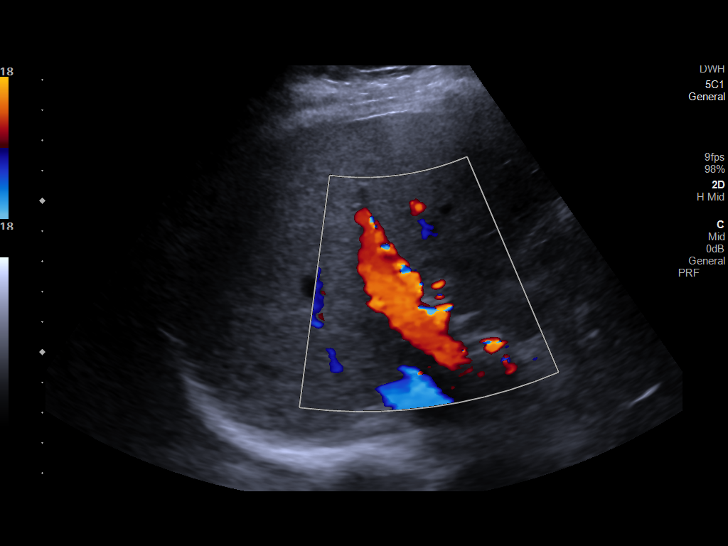

[14 of 25 positions shown; findings below may reference images not displayed]

FINDINGS: Gallbladder:

Tiny gallstones with layering gallbladder sludge. No gallbladder
wall thickening or pericholecystic fluid. Negative sonographic
Murphy's sign.

Common bile duct:

Diameter: 6 mm

Liver:

At the upper limits of normal for parenchymal echogenicity. No focal
hepatic lesion is seen. Portal vein is patent on color Doppler
imaging with normal direction of blood flow towards the liver.

Other: None.
IMPRESSION: Cholelithiasis, without associated sonographic findings to suggest
acute cholecystitis.
